# Patient Record
Sex: Male | Born: 1980
Health system: Southern US, Community
[De-identification: ages and names within clinical notes are randomized; demographics above are authoritative.]

## PROBLEM LIST (undated history)

## (undated) DIAGNOSIS — I471 Supraventricular tachycardia, unspecified: Secondary | ICD-10-CM

## (undated) DIAGNOSIS — F419 Anxiety disorder, unspecified: Secondary | ICD-10-CM

## (undated) HISTORY — DX: Anxiety disorder, unspecified: F41.9

---

## 2017-10-30 ENCOUNTER — Telehealth: Payer: Self-pay | Admitting: Medical

## 2017-10-30 ENCOUNTER — Encounter: Payer: Self-pay | Admitting: Medical

## 2017-10-30 ENCOUNTER — Ambulatory Visit: Payer: BLUE CROSS/BLUE SHIELD | Admitting: Medical

## 2017-10-30 VITALS — BP 132/72 | HR 71 | Resp 16 | Ht 72.0 in | Wt 167.6 lb

## 2017-10-30 DIAGNOSIS — R9431 Abnormal electrocardiogram [ECG] [EKG]: Secondary | ICD-10-CM

## 2017-10-30 DIAGNOSIS — R5383 Other fatigue: Secondary | ICD-10-CM

## 2017-10-30 DIAGNOSIS — R002 Palpitations: Secondary | ICD-10-CM | POA: Diagnosis not present

## 2017-10-30 LAB — TROPONIN I: Troponin I: <0.01 ng/mL (ref <=0.0)

## 2017-10-30 NOTE — Telephone Encounter (Signed)
Patient dropped off medical records and Ramon Dredgedward advised to place on [employer] desk. Made copies and gave the oringal back to patient

## 2017-10-30 NOTE — Patient Instructions (Addendum)
For your history of palpitations, I will refer you to cardiologist.  You had an extensive workup in [place] and would like copies of that workup to review and to send to the cardiologist.  You might need a Holter monitor.  During the interim I want you to hold/stop any caffeinated beverages and avoid any decongestants.  For fatigue since last palpitation event we will get CBC, CMP and TSH.  Your EKG overall looks good with probable artifact.  EKG machine different reading and will get a cardiac protein study to evaluate further.  If you get any recurrent palpitations that are constant or any chest pain that advise emergency department evaluation.  Regarding your syncope 1 year ago, I would like to review your old records first but also considering that you might need neurologist referral.  I am not sure  just yet as you only had one syncopal episode in the past.  Note if you do have syncope again at any point in time that would be emergency department evaluation same day.  Follow-up in 7 days or as needed.  EKG to me appears to be normal sinus rhythm.  I do not have an old EKG to compare.  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  To me it looks like he has artifact present.  My nurse did mention that he had a lot of hair on his chest.

## 2017-10-30 NOTE — Progress Notes (Signed)
Subjective:    Patient ID: Walter Harvey, male    DOB: [birth year], 36 y.o.   MRN: [id number]  HPI  Pt in for first time.   Pt is originally from [place]. Pt works at [employer]. Pt does not work out regularly. He likes to ride bicycle. Married- 3 children.  Pt states last week he had feeling of heart palpitation. He states he felt his heart beat out of sequence. This happened day after holloween. He drank 6 beers at least. Maybe more. Then next day felt palpitations.  Monday following holloween brief palpitation as well.(thse episodes lasted a minute or so then subsided)   Pt states a year ago after having sex he sat up quickly and seemed to shaked a little bit(wife told him later he looked stiff and rigid). Pt went to ED. He had work up. He states later got  stress tes, tilt test and echo all were negative. Before he past out he had no palpitation.  Work up done in [place]. No hx of seizure. No hx of head trauma.  Pt states in past never had holter monitor.     Review of Systems  Constitutional: Negative for chills, fatigue and fever.  Respiratory: Negative for cough, chest tightness, shortness of breath and wheezing.   Cardiovascular: Positive for palpitations. Negative for chest pain.  Gastrointestinal: Negative for abdominal pain, constipation, diarrhea, nausea and vomiting.  Musculoskeletal: Negative for back pain, joint swelling and neck pain.  Skin: Negative for rash.  Neurological: Negative for dizziness, speech difficulty, weakness, numbness and headaches.  Hematological: Negative for adenopathy. Does not bruise/bleed easily.  Psychiatric/Behavioral: Negative for behavioral problems, confusion and sleep disturbance. The patient is not nervous/anxious.      No past medical history on file.   Social History   Socioeconomic History  . Marital status: Married    Spouse name: Not on file  . Number of children: Not on file  . Years of education: Not on file  . Highest  education level: Not on file  Social Needs  . Financial resource strain: Not on file  . Food insecurity - worry: Not on file  . Food insecurity - inability: Not on file  . Transportation needs - medical: Not on file  . Transportation needs - non-medical: Not on file  Occupational History  . Not on file  Tobacco Use  . Smoking status: Never Smoker  . Smokeless tobacco: Never Used  Substance and Sexual Activity  . Alcohol use: Yes  . Drug use: No  . Sexual activity: Not on file  Other Topics Concern  . Not on file  Social History Narrative  . Not on file    No past surgical history on file.  No family history on file.  Not on File  No current outpatient medications on file prior to visit.   No current facility-administered medications on file prior to visit.     BP 132/72   Pulse 71   Resp 16   Ht 6' (1.829 m)   Wt 167 lb 9.6 oz (76 kg)   SpO2 97%   BMI 22.73 kg/m       Objective:   Physical Exam  General Mental Status- Alert. General Appearance- Not in acute distress.   Skin General: Color- Normal Color. Moisture- Normal Moisture.  Neck Carotid Arteries- Normal color. Moisture- Normal Moisture. No carotid bruits. No JVD.  Chest and Lung Exam Auscultation: Breath Sounds:-Normal.  Cardiovascular Auscultation:Rythm- Regular. Murmurs & Other Heart Sounds:Auscultation of  the heart reveals- No Murmurs.  Abdomen Inspection:-Inspeection Normal. Palpation/Percussion:Note:No mass. Palpation and Percussion of the abdomen reveal- Non Tender, Non Distended + BS, no rebound or guarding.   Neurologic Cranial Nerve exam:- CN III-XII intact(No nystagmus), symmetric smile. Strength:- 5/5 equal and symmetric strength both upper and lower extremities.      Assessment & Plan:  For your history of palpitations, I will refer you to cardiologist.  You had an extensive workup in [place] and would like copies of that workup to review and to send to the cardiologist.   You might need a Holter monitor.  During the interim I want you to hold/stop any caffeinated beverages and avoid any decongestants.  For fatigue since last palpitation event we will get CBC, CMP and TSH.  Your EKG overall looks good with probable artifact.  Machine EKG has differing reading and will get a cardiac protein study to evaluate further.  If you get any recurrent palpitations that are constant or any chest pain that advise emergency department evaluation.  Regarding your syncope 1 year ago, I would like to review your old records first but also considering that you might need neurologist referral.  I am not sure  just yet as you only had one syncopal episode in the past.  Note if you do have syncope again at any point in time that would be emergency department evaluation same day.  EKG to me appears to be normal sinus rhythm.  I do not have an old EKG to compare.  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  To me it looks like he has artifact present.  My nurse did mention that he had a lot of hair on his chest.   Follow-up in 7 days or as needed.  Purva Vessell, Ramon DredgeEdward, PA-C

## 2017-10-31 LAB — COMPREHENSIVE METABOLIC PANEL
ALT: 22 U/L (ref 0–53)
AST: 18 U/L (ref 0–37)
Albumin: 5 g/dL (ref 3.5–5.2)
Alkaline Phosphatase: 52 U/L (ref 39–117)
BILIRUBIN TOTAL: 1 mg/dL (ref 0.2–1.2)
BUN: 15 mg/dL (ref 6–23)
CALCIUM: 9.4 mg/dL (ref 8.4–10.5)
CO2: 31 mEq/L (ref 19–32)
Chloride: 102 mEq/L (ref 96–112)
Creatinine, Ser: 0.9 mg/dL (ref 0.40–1.50)
GFR: 100.3 mL/min (ref >60.00)
GLUCOSE: 95 mg/dL (ref 70–99)
Potassium: 4.2 mEq/L (ref 3.5–5.1)
SODIUM: 142 meq/L (ref 135–145)
Total Protein: 7.9 g/dL (ref 6.0–8.3)

## 2017-10-31 LAB — CBC WITH DIFFERENTIAL/PLATELET
BASOS PCT: 0.7 % (ref 0.0–3.0)
Basophils Absolute: 0 10*3/uL (ref 0.0–0.1)
EOS PCT: 2.3 % (ref 0.0–5.0)
Eosinophils Absolute: 0.2 10*3/uL (ref 0.0–0.7)
HCT: 45 % (ref 39.0–52.0)
Hemoglobin: 15.4 g/dL (ref 13.0–17.0)
LYMPHS ABS: 1.9 10*3/uL (ref 0.7–4.0)
Lymphocytes Relative: 28 % (ref 12.0–46.0)
MCHC: 34.3 g/dL (ref 30.0–36.0)
MCV: 96.2 fl (ref 78.0–100.0)
MONO ABS: 0.8 10*3/uL (ref 0.1–1.0)
Monocytes Relative: 11.8 % (ref 3.0–12.0)
NEUTROS ABS: 3.9 10*3/uL (ref 1.4–7.7)
NEUTROS PCT: 57.2 % (ref 43.0–77.0)
Platelets: 291 10*3/uL (ref 150.0–400.0)
RBC: 4.68 Mil/uL (ref 4.22–5.81)
RDW: 13.1 % (ref 11.5–15.5)
WBC: 6.8 10*3/uL (ref 4.0–10.5)

## 2017-10-31 LAB — TSH: TSH: 0.89 u[IU]/mL (ref 0.35–4.50)

## 2017-10-31 NOTE — Telephone Encounter (Signed)
PCP has records.

## 2017-11-01 ENCOUNTER — Telehealth: Payer: Self-pay | Admitting: *Deleted

## 2017-11-01 NOTE — Telephone Encounter (Signed)
Received results from [facility]; forwarded to provider/SLS 11/14

## 2017-11-13 ENCOUNTER — Ambulatory Visit: Payer: BLUE CROSS/BLUE SHIELD | Admitting: Cardiology

## 2017-11-16 ENCOUNTER — Encounter: Payer: Self-pay | Admitting: *Deleted

## 2017-11-20 ENCOUNTER — Encounter: Payer: Self-pay | Admitting: Cardiology

## 2017-11-20 ENCOUNTER — Ambulatory Visit: Payer: BLUE CROSS/BLUE SHIELD | Admitting: Cardiology

## 2017-11-20 DIAGNOSIS — I493 Ventricular premature depolarization: Secondary | ICD-10-CM | POA: Diagnosis not present

## 2017-11-20 DIAGNOSIS — R9431 Abnormal electrocardiogram [ECG] [EKG]: Secondary | ICD-10-CM | POA: Diagnosis not present

## 2017-11-20 DIAGNOSIS — R002 Palpitations: Secondary | ICD-10-CM | POA: Diagnosis not present

## 2017-11-20 NOTE — Patient Instructions (Addendum)
Medication Instructions:  Your physician recommends that you continue on your current medications as directed. Please refer to the Current Medication list given to you today.  Labwork: None   Testing/Procedures: Your physician has recommended that you wear an event monitor. Event monitors are medical devices that record the heart's electrical activity. Doctors most often us these monitors to diagnose arrhythmias. Arrhythmias are problems with the speed or rhythm of the heartbeat. The monitor is a small, portable device. You can wear one while you do your normal daily activities. This is usually used to diagnose what is causing palpitations/syncope (passing out).  Follow-Up: Your physician recommends that you schedule a follow-up appointment in: 6 weeks   Any Other Special Instructions Will Be Listed Below (If Applicable).  Please note that any paperwork needing to be filled out by the provider will need to be addressed at the front desk prior to seeing the provider. Please note that any paperwork FMLA, Disability or other documents regarding health condition is subject to a $25.00 charge that must be received prior to completion of paperwork in the form of a money order or check.     If you need a refill on your cardiac medications before your next appointment, please call your pharmacy.  

## 2017-11-20 NOTE — Progress Notes (Signed)
Cardiology Consultation:    Date:  11/20/2017   ID:  Walter ParentsMarcus Bolte, DOB 12-09-1981, MRN [id number]  PCP:  Esperanza RichtersSaguier, Edward, PA-C  Cardiologist:  Gypsy Balsamobert Krasowski, MD   Referring MD: Marisue BrooklynSaguier, Edward, PA-C   Chief Complaint  Patient presents with  . Palpitations  I am having palpitations  History of Present Illness:    Walter Harvey is a 36 y.o. male who is being seen today for the evaluation of palpitations at the request of Saguier, Ramon Dredgedward, [place]-C.  He is originally from [place] he came to this country in April to work full [employer].  He described quite stressful time he seems to be adapting quite well to new environment however his family does have some difficulties and he feels a lot of pressure on being responsible for taking care of them.  He has been experiencing some strange heartbeats and he had difficulty describing it it typically happens at rest when he does not do anything he feels that his heart beats to slow and somewhat forcefully.  There is no irregularity and that there is no tachycardia.  Interestingly he likes to exercise he rides a bike he also would like to go to gym when he does not he feels great he does not have any difficulties.  He brought some test from [place] in January he ended up going to a cardiologist because of one episode of syncope.  He apparently ate a lot he laid down and after that he got up quickly became dizzy and passed out.  He brought results of those test to me and there is an echocardiogram which was normal, he also had a stress test which was normal.  Tilt table test was done as well all were normal.  Recently he had batteries of blood test and those were fine.  Interesting no cholesterol check.  No past medical history on file.  No past surgical history on file.  Current Medications: No outpatient medications have been marked as taking for the 11/20/17 encounter (Office Visit) with Georgeanna LeaKrasowski, Robert J, MD.     Allergies:   Patient has no  known allergies.   Social History   Socioeconomic History  . Marital status: Married    Spouse name: None  . Number of children: None  . Years of education: None  . Highest education level: None  Social Needs  . Financial resource strain: None  . Food insecurity - worry: None  . Food insecurity - inability: None  . Transportation needs - medical: None  . Transportation needs - non-medical: None  Occupational History  . None  Tobacco Use  . Smoking status: Never Smoker  . Smokeless tobacco: Never Used  Substance and Sexual Activity  . Alcohol use: Yes  . Drug use: No  . Sexual activity: None  Other Topics Concern  . None  Social History Narrative  . None     Family History: The patient's family history is not on file. ROS:   Please see the history of present illness.    All 14 point review of systems negative except as described per history of present illness.  EKGs/Labs/Other Studies Reviewed:    The following studies were reviewed today: Echocardiogram, stress test, tilt table reviewed in January of this year in [place]  EKG:  EKG is  ordered today.  The ekg ordered today demonstrates sinus rhythm with sinus arrhythmia there is some PVCs, right axis deviation, pulmonary disease pattern,  Recent Labs: 10/30/2017: ALT 22; BUN 15;  Creatinine, Ser 0.90; Hemoglobin 15.4; Platelets 291.0; Potassium 4.2; Sodium 142; TSH 0.89  Recent Lipid Panel No results found for: CHOL, TRIG, HDL, CHOLHDL, VLDL, LDLCALC, LDLDIRECT  Physical Exam:    VS:  BP 102/64   Pulse 64   Resp 10   Ht 6' (1.829 m)   Wt 168 lb 1.9 oz (76.3 kg)   BMI 22.80 kg/m     Wt Readings from Last 3 Encounters:  11/20/17 168 lb 1.9 oz (76.3 kg)  10/30/17 167 lb 9.6 oz (76 kg)     GEN:  Well nourished, well developed in no acute distress HEENT: Normal NECK: No JVD; No carotid bruits LYMPHATICS: No lymphadenopathy CARDIAC: RRR, no murmurs, no rubs, no gallops RESPIRATORY:  Clear to auscultation  without rales, wheezing or rhonchi  ABDOMEN: Soft, non-tender, non-distended MUSCULOSKELETAL:  No edema; No deformity  SKIN: Warm and dry NEUROLOGIC:  Alert and oriented x 3 PSYCHIATRIC:  Normal affect   ASSESSMENT:    1. Palpitations   2. PVC's (premature ventricular contractions)   3. Abnormal EKG    PLAN:    In order of problems listed above:  1. Palpitations: I will ask him to wear event recorder.  I will like to know exactly what kind of arrhythmia he is experiencing.  I will not initiate any therapy for now.  The fact that in January he got the test done in [place] there were normal is encouraging.  On top of that he does not have any symptoms while exercising. 2. PVCs seem gentleman with apparently normal echocardiogram normal stress test.  Will wait for event recorder. 3. Abnormal EKG: Quite surprising since recent test done in [place] were normal.  We may be forced to repeat at least echocardiogram.   Medication Adjustments/Labs and Tests Ordered: Current medicines are reviewed at length with the patient today.  Concerns regarding medicines are outlined above.  No orders of the defined types were placed in this encounter.  No orders of the defined types were placed in this encounter.   Signed, Georgeanna Leaobert J. Krasowski, MD, [facility]. 11/20/2017 3:37 PM    [facility]

## 2017-11-22 ENCOUNTER — Ambulatory Visit: Payer: BLUE CROSS/BLUE SHIELD

## 2017-11-22 DIAGNOSIS — R002 Palpitations: Secondary | ICD-10-CM

## 2017-12-21 ENCOUNTER — Other Ambulatory Visit: Payer: Self-pay

## 2017-12-21 MED ORDER — METOPROLOL TARTRATE 25 MG PO TABS: 25.0000 mg | ORAL_TABLET | Freq: Two times a day (BID) | ORAL | 6 refills | Status: DC

## 2017-12-21 MED FILL — METOPROLOL TARTRATE 25 MG T: 25 | 30 days supply | Qty: 60 | Fill #0

## 2018-01-01 ENCOUNTER — Encounter: Payer: Self-pay | Admitting: Cardiology

## 2018-01-01 ENCOUNTER — Ambulatory Visit (INDEPENDENT_AMBULATORY_CARE_PROVIDER_SITE_OTHER): Payer: BLUE CROSS/BLUE SHIELD | Admitting: Cardiology

## 2018-01-01 DIAGNOSIS — I471 Supraventricular tachycardia, unspecified: Secondary | ICD-10-CM | POA: Insufficient documentation

## 2018-01-01 MED ORDER — METOPROLOL SUCCINATE ER 50 MG PO TB24: 50.0000 mg | ORAL_TABLET | Freq: Every day | ORAL | 6 refills | Status: DC

## 2018-01-01 MED FILL — METOPROLOL SUCC ER 50 MG TA: 50 | 30 days supply | Qty: 30 | Fill #0

## 2018-01-01 NOTE — Progress Notes (Signed)
Cardiology Office Note:    Date:  01/01/2018   ID:  Walter Harvey Pursell, DOB 28-Dec-1980, MRN [id number]  PCP:  Esperanza RichtersSaguier, Edward, PA-C  Cardiologist:  Gypsy Balsamobert Elize Pinon, MD    Referring MD: Marisue BrooklynSaguier, Edward, PA-C   Chief Complaint  Patient presents with  . 6 week follow up  He is doing well  History of Present Illness:    Walter Harvey Eleazer is a 37 y.o. male with palpitations as well as episodes of syncope.  He is originally from [place] he works Engineer, [employer] here.  [place] he did have quite extensive evaluation done which include echocardiogram which was normal: There was also stress test done which was normal, tilt table test.  All were normal.  Triggered this investigation was the fact that he passed out.  However he still complained of having some palpitations which include skipped beats as well as sustained palpitations.  He feels his heart speeding up.  There is no chest pain tightness squeezing pressure burning chest.  I put a event recorder on him an event recorder showed narrow complex tachycardia.  Put him on a beta-blocker and seems to be responding to the therapy quite well.  No past medical history on file.  No past surgical history on file.  Current Medications: Current Meds  Medication Sig  . metoprolol tartrate (LOPRESSOR) 25 MG tablet Take 1 tablet (25 mg total) by mouth 2 (two) times daily.     Allergies:   Patient has no known allergies.   Social History   Socioeconomic History  . Marital status: Married    Spouse name: None  . Number of children: None  . Years of education: None  . Highest education level: None  Social Needs  . Financial resource strain: None  . Food insecurity - worry: None  . Food insecurity - inability: None  . Transportation needs - medical: None  . Transportation needs - non-medical: None  Occupational History  . None  Tobacco Use  . Smoking status: Never Smoker  . Smokeless tobacco: Never Used  Substance and  Sexual Activity  . Alcohol use: Yes  . Drug use: No  . Sexual activity: None  Other Topics Concern  . None  Social History Narrative  . None     Family History: The patient's family history is not on file. ROS:   Please see the history of present illness.    All 14 point review of systems negative except as described per history of present illness  EKGs/Labs/Other Studies Reviewed:      Recent Labs: 10/30/2017: ALT 22; BUN 15; Creatinine, Ser 0.90; Hemoglobin 15.4; Platelets 291.0; Potassium 4.2; Sodium 142; TSH 0.89  Recent Lipid Panel No results found for: CHOL, TRIG, HDL, CHOLHDL, VLDL, LDLCALC, LDLDIRECT  Physical Exam:    VS:  BP 104/70   Pulse 77   Ht 6' (1.829 m)   Wt 164 lb 1.9 oz (74.4 kg)   SpO2 98%   BMI 22.26 kg/m     Wt Readings from Last 3 Encounters:  01/01/18 164 lb 1.9 oz (74.4 kg)  11/20/17 168 lb 1.9 oz (76.3 kg)  10/30/17 167 lb 9.6 oz (76 kg)     GEN:  Well nourished, well developed in no acute distress HEENT: Normal NECK: No JVD; No carotid bruits LYMPHATICS: No lymphadenopathy CARDIAC: RRR, no murmurs, no rubs, no gallops RESPIRATORY:  Clear to auscultation without rales, wheezing or rhonchi  ABDOMEN: Soft, non-tender, non-distended MUSCULOSKELETAL:  No  edema; No deformity  SKIN: Warm and dry LOWER EXTREMITIES: no swelling NEUROLOGIC:  Alert and oriented x 3 PSYCHIATRIC:  Normal affect   ASSESSMENT:    1. PSVT (paroxysmal supraventricular tachycardia) (HCC)    PLAN:    In order of problems listed above:  1. Pro ventricle tachycardia: Responded quite nicely so far to beta-blocker we talked about options in this situation he is interested in ablation.  He wants to be referred to EP for consideration of supraventricular tachycardia ablation.  I briefly explained to him procedure potential risks as well as benefits and he still want to go that route.  I will schedule him to see our EP team. 2. Abnormal EKG: His echocardiogram was done  in [place] just within the last year which was normal however he may end up repeating the test. 3. PVCs: He is on beta-blocker and I will change him to long-acting form of metoprolol for convenience. 4. Syncope: About a year ago quite extensive workup has been done all is negative.  Will refer him to EP for consideration of SVT ablation   Medication Adjustments/Labs and Tests Ordered: Current medicines are reviewed at length with the patient today.  Concerns regarding medicines are outlined above.  No orders of the defined types were placed in this encounter.  Medication changes: No orders of the defined types were placed in this encounter.   Signed, Georgeanna Lea, MD, [facility] 01/01/2018 3:26 PM    [facility]

## 2018-01-01 NOTE — Patient Instructions (Signed)
Medication Instructions:  Your physician has recommended you make the following change in your medication:  Stop the Metoprolol Tartrate Start Metoprolol Succinate 1 time daily  Labwork: None ordered  Testing/Procedures: None ordered  Follow-Up: Your physician recommends that you schedule a follow-up appointment in: 2 months with Dr. Nikki DomKrasowski  You will also be contacted by our EP scheduling for an appointment.   Any Other Special Instructions Will Be Listed Below (If Applicable).     If you need a refill on your cardiac medications before your next appointment, please call your pharmacy.

## 2018-01-16 ENCOUNTER — Ambulatory Visit: Payer: BLUE CROSS/BLUE SHIELD | Admitting: Cardiology

## 2018-01-16 ENCOUNTER — Encounter: Payer: Self-pay | Admitting: Cardiology

## 2018-01-16 ENCOUNTER — Encounter (INDEPENDENT_AMBULATORY_CARE_PROVIDER_SITE_OTHER): Payer: Self-pay

## 2018-01-16 VITALS — BP 102/64 | HR 66 | Ht 72.0 in | Wt 163.0 lb

## 2018-01-16 DIAGNOSIS — I471 Supraventricular tachycardia: Secondary | ICD-10-CM

## 2018-01-16 NOTE — Progress Notes (Signed)
Electrophysiology Office Note   Date:  01/16/2018   ID:  Tirso Laws, DOB [birth year], MRN [id number]  PCP:  Esperanza Richters, PA-C  Cardiologist:  Bing Matter Primary Electrophysiologist:  Regan Lemming, MD    Chief Complaint  Patient presents with  . Advice Only    SVT/Discuss SVT ablation     History of Present Illness: Walter Harvey is a 37 y.o. male who is being seen today for the evaluation of SVT at the request of Mauri Brooklyn. Presenting today for electrophysiology evaluation.  He is originally from [place].  He came to the [place] to work for [employer] in April.  He had been having palpitations.  They typically occur when at rest.  No regularity and no tachycardia.  He did have one episode of syncope in [place].  Previous echo and stress test, as well as tilt table were normal.  Event recorder showed a narrow complex tachycardia.  He was put on Toprol-XL.  10 you to have short episodes on Toprol-XL.  His main symptoms are palpitations.  He does not get short of breath or weak or fatigued.  His symptoms occur mainly when he is tired or sleep deprived.    Today, he denies symptoms of palpitations, chest pain, shortness of breath, orthopnea, PND, lower extremity edema, claudication, dizziness, presyncope, syncope, bleeding, or neurologic sequela. The patient is tolerating medications without difficulties.    No past medical history on file. no past history No past surgical history on file. no surgical history   Current Outpatient Medications  Medication Sig Dispense Refill  . metoprolol succinate (TOPROL-XL) 50 MG 24 hr tablet Take 1 tablet (50 mg total) by mouth daily. Take with or immediately following a meal. 30 tablet 6   No current facility-administered medications for this visit.     Allergies:   Patient has no known allergies.   Social History:  The patient  reports that  has never smoked. he has never used smokeless tobacco. He reports that he drinks alcohol. He  reports that he does not use drugs.   Family History:  The patient's family is without health problems   ROS:  Please see the history of present illness.   Otherwise, review of systems is positive for palpitations.   All other systems are reviewed and negative.    PHYSICAL EXAM: VS:  BP 102/64   Pulse 66   Ht 6' (1.829 m)   Wt 163 lb (73.9 kg)   BMI 22.11 kg/m  , BMI Body mass index is 22.11 kg/m. GEN: Well nourished, well developed, in no acute distress  HEENT: normal  Neck: no JVD, carotid bruits, or masses Cardiac: RRR; no murmurs, rubs, or gallops,no edema  Respiratory:  clear to auscultation bilaterally, normal work of breathing GI: soft, nontender, nondistended, + BS MS: no deformity or atrophy  Skin: warm and dry Neuro:  Strength and sensation are intact Psych: euthymic mood, full affect  EKG:  EKG is not ordered today. Personal review of the ekg ordered 11/20/17 shows sinus rhythm, PVCs, rate 80  Recent Labs: 10/30/2017: ALT 22; BUN 15; Creatinine, Ser 0.90; Hemoglobin 15.4; Platelets 291.0; Potassium 4.2; Sodium 142; TSH 0.89    Lipid Panel  No results found for: CHOL, TRIG, HDL, CHOLHDL, VLDL, LDLCALC, LDLDIRECT   Wt Readings from Last 3 Encounters:  01/16/18 163 lb (73.9 kg)  01/01/18 164 lb 1.9 oz (74.4 kg)  11/20/17 168 lb 1.9 oz (76.3 kg)      Other studies Reviewed:  Additional studies/ records that were reviewed today include: Holter 11/22/17 - personally reviewed  Review of the above records today demonstrates: SVT   ASSESSMENT AND PLAN:  1.  SVT: Found on Holter monitor.  He has also had multiple PVCs.  He appears to be asymptomatic from his PVCs.  I did discuss ablation for his SVT.  Risks and benefits include bleeding, tamponade, heart block, and stroke.  He understands these risks and would like to consider his options further.  We also discussed adjusting medications.  He Milah Recht call his insurance company to check on prices for  ablation.    Current medicines are reviewed at length with the patient today.   The patient does not have concerns regarding his medicines.  The following changes were made today:  none  Labs/ tests ordered today include:  No orders of the defined types were placed in this encounter.  Note sent to referring provider  Disposition:   FU with Aleesha Ringstad 6 months  Signed, Mozetta Murfin Jorja LoaMartin Oluwademilade Kellett, MD  01/16/2018 10:25 AM     [facility] [address] [place] [address] [phone] (office) [phone] (fax)

## 2018-01-16 NOTE — Patient Instructions (Addendum)
Medication Instructions:  Your physician recommends that you continue on your current medications as directed. Please refer to the Current Medication list given to you today.  * If you need a refill on your cardiac medications before your next appointment, please call your pharmacy. *  Labwork: None ordered  Testing/Procedures: Your physician has recommended that you have an ablation. Catheter ablation is a medical procedure used to treat some cardiac arrhythmias (irregular heartbeats). During catheter ablation, a long, thin, flexible tube is put into a blood vessel in your groin (upper thigh), or neck. This tube is called an ablation catheter. It is then guided to your heart through the blood vessel. Radio frequency waves destroy small areas of heart tissue where abnormal heartbeats may cause an arrhythmia to start.   Please call the office if you decide to proceed with ablation, otherwise we will see you in 6 months  Follow-Up: Your physician wants you to follow-up in: 6 months with Dr. Elberta Fortisamnitz.  You will receive a reminder letter in the mail two months in advance. If you don't receive a letter, please call our office to schedule the follow-up appointment.  Thank you for choosing [facility]!!    Dory HornSherri Taline Nass, RN [phone]  Any Other Special Instructions Will Be Listed Below (If Applicable).  CPT code for ablation: [id number]   Cardiac Ablation Cardiac ablation is a procedure to disable (ablate) a small amount of heart tissue in very specific places. The heart has many electrical connections. Sometimes these connections are abnormal and can cause the heart to beat very fast or irregularly. Ablating some of the problem areas can improve the heart rhythm or return it to normal. Ablation may be done for people who:  Have Wolff-Parkinson-White syndrome.  Have fast heart rhythms (tachycardia).  Have taken medicines for an abnormal heart rhythm (arrhythmia) that were not effective or  caused side effects.  Have a high-risk heartbeat that may be life-threatening.  During the procedure, a small incision is made in the neck or the groin, and a long, thin, flexible tube (catheter) is inserted into the incision and moved to the heart. Small devices (electrodes) on the tip of the catheter will send out electrical currents. A type of X-ray (fluoroscopy) will be used to help guide the catheter and to provide images of the heart. Tell a health care provider about:  Any allergies you have.  All medicines you are taking, including vitamins, herbs, eye drops, creams, and over-the-counter medicines.  Any problems you or family members have had with anesthetic medicines.  Any blood disorders you have.  Any surgeries you have had.  Any medical conditions you have, such as kidney failure.  Whether you are pregnant or may be pregnant. What are the risks? Generally, this is a safe procedure. However, problems may occur, including:  Infection.  Bruising and bleeding at the catheter insertion site.  Bleeding into the chest, especially into the sac that surrounds the heart. This is a serious complication.  Stroke or blood clots.  Damage to other structures or organs.  Allergic reaction to medicines or dyes.  Need for a permanent pacemaker if the normal electrical system is damaged. A pacemaker is a small computer that sends electrical signals to the heart and helps your heart beat normally.  The procedure not being fully effective. This may not be recognized until months later. Repeat ablation procedures are sometimes required.  What happens before the procedure?  Follow instructions from your health care provider about eating  or drinking restrictions.  Ask your health care provider about: ? Changing or stopping your regular medicines. This is especially important if you are taking diabetes medicines or blood thinners. ? Taking medicines such as aspirin and ibuprofen.  These medicines can thin your blood. Do not take these medicines before your procedure if your health care provider instructs you not to.  Plan to have someone take you home from the hospital or clinic.  If you will be going home right after the procedure, plan to have someone with you for 24 hours. What happens during the procedure?  To lower your risk of infection: ? Your health care team will wash or sanitize their hands. ? Your skin will be washed with soap. ? Hair may be removed from the incision area.  An IV tube will be inserted into one of your veins.  You will be given a medicine to help you relax (sedative).  The skin on your neck or groin will be numbed.  An incision will be made in your neck or your groin.  A needle will be inserted through the incision and into a large vein in your neck or groin.  A catheter will be inserted into the needle and moved to your heart.  Dye may be injected through the catheter to help your surgeon see the area of the heart that needs treatment.  Electrical currents will be sent from the catheter to ablate heart tissue in desired areas. There are three types of energy that may be used to ablate heart tissue: ? Heat (radiofrequency energy). ? Laser energy. ? Extreme cold (cryoablation).  When the necessary tissue has been ablated, the catheter will be removed.  Pressure will be held on the catheter insertion area to prevent excessive bleeding.  A bandage (dressing) will be placed over the catheter insertion area. The procedure may vary among health care providers and hospitals. What happens after the procedure?  Your blood pressure, heart rate, breathing rate, and blood oxygen level will be monitored until the medicines you were given have worn off.  Your catheter insertion area will be monitored for bleeding. You will need to lie still for a few hours to ensure that you do not bleed from the catheter insertion area.  Do not drive  for 24 hours or as long as directed by your health care provider. Summary  Cardiac ablation is a procedure to disable (ablate) a small amount of heart tissue in very specific places. Ablating some of the problem areas can improve the heart rhythm or return it to normal.  During the procedure, electrical currents will be sent from the catheter to ablate heart tissue in desired areas. This information is not intended to replace advice given to you by your health care provider. Make sure you discuss any questions you have with your health care provider. Document Released: 04/23/2009 Document Revised: 10/24/2016 Document Reviewed: 10/24/2016 Elsevier Interactive Patient Education  [employer].

## 2018-01-17 ENCOUNTER — Institutional Professional Consult (permissible substitution): Payer: BLUE CROSS/BLUE SHIELD | Admitting: Cardiology

## 2018-01-24 ENCOUNTER — Institutional Professional Consult (permissible substitution): Payer: BLUE CROSS/BLUE SHIELD | Admitting: Cardiology

## 2018-01-26 DIAGNOSIS — J029 Acute pharyngitis, unspecified: Secondary | ICD-10-CM | POA: Diagnosis not present

## 2018-01-26 DIAGNOSIS — R6889 Other general symptoms and signs: Secondary | ICD-10-CM | POA: Diagnosis not present

## 2018-01-26 MED FILL — OSELTAMIVIR PHOSPHATE 75 MG: 75 | 5 days supply | Qty: 10 | Fill #0

## 2018-01-26 MED FILL — METOPROLOL SUCC ER 50 MG TA: 50 | 30 days supply | Qty: 30 | Fill #1

## 2018-02-07 DIAGNOSIS — J020 Streptococcal pharyngitis: Secondary | ICD-10-CM | POA: Diagnosis not present

## 2018-03-07 ENCOUNTER — Encounter: Payer: Self-pay | Admitting: Cardiology

## 2018-03-07 ENCOUNTER — Ambulatory Visit: Payer: BLUE CROSS/BLUE SHIELD | Admitting: Cardiology

## 2018-03-07 VITALS — BP 120/66 | HR 73 | Ht 72.0 in | Wt 163.1 lb

## 2018-03-07 DIAGNOSIS — R002 Palpitations: Secondary | ICD-10-CM | POA: Diagnosis not present

## 2018-03-07 DIAGNOSIS — I493 Ventricular premature depolarization: Secondary | ICD-10-CM

## 2018-03-07 DIAGNOSIS — I471 Supraventricular tachycardia, unspecified: Secondary | ICD-10-CM

## 2018-03-07 MED ORDER — METOPROLOL SUCCINATE ER 50 MG PO TB24: ORAL_TABLET | ORAL | 6 refills | Status: DC

## 2018-03-07 MED FILL — METOPROLOL SUCCINATE ER 50: 50 | 30 days supply | Qty: 45 | Fill #0

## 2018-03-07 NOTE — Progress Notes (Signed)
Cardiology Office Note:    Date:  03/07/2018   ID:  Walter Harvey Camilo, DOB [birth year], MRN [id number]  PCP:  Esperanza RichtersSaguier, Edward, PA-C  Cardiologist:  Gypsy Balsamobert Zanetta Dehaan, MD    Referring MD: Marisue BrooklynSaguier, Edward, PA-C   Chief Complaint  Patient presents with  . 2 month follow up  Still having some palpitations  History of Present Illness:    Walter Harvey Faso is a 37 y.o. male with supraventricular tachycardia and palpitations.  We had a long discussion about what to do with the situation he was referred to EP and SVT ablation was offered to him he still undecided which way he wants to do first he tells me that he wants to have ablation that he start talking about potentially higher price that he does not want to do it until he knows how much she will paid for it.  I presented him with 3 options option #1 tinea doing what we are doing right now with the understanding that he will get some palpitations from time to time.  Second option was to increase dose of beta-blocker therapy option to proceed with ablation.  He agreed to increase dose of his metoprolol therefore I will check his EKG if EKG is fine with Toprol dose will be increased.  In the meantime we will investigate how much it will cost to do ablation and try to advise him of that.  No past medical history on file.  No past surgical history on file.  Current Medications: Current Meds  Medication Sig  . metoprolol succinate (TOPROL-XL) 50 MG 24 hr tablet Take 1 tablet (50 mg total) by mouth daily. Take with or immediately following a meal.     Allergies:   Patient has no known allergies.   Social History   Socioeconomic History  . Marital status: Married    Spouse name: None  . Number of children: None  . Years of education: None  . Highest education level: None  Social Needs  . Financial resource strain: None  . Food insecurity - worry: None  . Food insecurity - inability: None  . Transportation needs - medical: None  .  Transportation needs - non-medical: None  Occupational History  . None  Tobacco Use  . Smoking status: Never Smoker  . Smokeless tobacco: Never Used  Substance and Sexual Activity  . Alcohol use: Yes  . Drug use: No  . Sexual activity: None  Other Topics Concern  . None  Social History Narrative  . None     Family History: The patient's family history is not on file. ROS:   Please see the history of present illness.    All 14 point review of systems negative except as described per history of present illness  EKGs/Labs/Other Studies Reviewed:      Recent Labs: 10/30/2017: ALT 22; BUN 15; Creatinine, Ser 0.90; Hemoglobin 15.4; Platelets 291.0; Potassium 4.2; Sodium 142; TSH 0.89  Recent Lipid Panel No results found for: CHOL, TRIG, HDL, CHOLHDL, VLDL, LDLCALC, LDLDIRECT  Physical Exam:    VS:  BP 120/66   Pulse 73   Ht 6' (1.829 m)   Wt 163 lb 1.9 oz (74 kg)   SpO2 98%   BMI 22.12 kg/m     Wt Readings from Last 3 Encounters:  03/07/18 163 lb 1.9 oz (74 kg)  01/16/18 163 lb (73.9 kg)  01/01/18 164 lb 1.9 oz (74.4 kg)     GEN:  Well nourished, well developed in no acute distress  HEENT: Normal NECK: No JVD; No carotid bruits LYMPHATICS: No lymphadenopathy CARDIAC: RRR, no murmurs, no rubs, no gallops RESPIRATORY:  Clear to auscultation without rales, wheezing or rhonchi  ABDOMEN: Soft, non-tender, non-distended MUSCULOSKELETAL:  No edema; No deformity  SKIN: Warm and dry LOWER EXTREMITIES: no swelling NEUROLOGIC:  Alert and oriented x 3 PSYCHIATRIC:  Normal affect   ASSESSMENT:    1. PSVT (paroxysmal supraventricular tachycardia) (HCC)   2. PVC's (premature ventricular contractions)   3. Palpitations    PLAN:    In order of problems listed above:  1. Paroxysmal supraventricular tachycardia: Plan as outlined above.  EKG will be done. 2. Palpitations: Will do EKG to see if I can increase dose of his beta-blocker.   Medication Adjustments/Labs and  Tests Ordered: Current medicines are reviewed at length with the patient today.  Concerns regarding medicines are outlined above.  No orders of the defined types were placed in this encounter.  Medication changes: No orders of the defined types were placed in this encounter.   Signed, Georgeanna Lea, MD, [facility] 03/07/2018 2:32 PM     [facility]

## 2018-03-07 NOTE — Patient Instructions (Addendum)
Medication Instructions:  Your physician has recommended you make the following change in your medication:  INCREASE metroprolol succinate 75 mg daily (take 0.5 tablet in the morning and 1 whole tablet in evening)  Labwork: None  Testing/Procedures: You had an EKG today.   Follow-Up: Your physician recommends that you schedule a follow-up appointment in: 3 months.   Any Other Special Instructions Will Be Listed Below (If Applicable).     If you need a refill on your cardiac medications before your next appointment, please call your pharmacy.

## 2018-03-12 ENCOUNTER — Telehealth: Payer: Self-pay | Admitting: Cardiology

## 2018-03-12 NOTE — Telephone Encounter (Signed)
Informed pt that I would "schedule" procedure for late April.  Explained that I would send request to pre-certify with insurance and call him once I know this has been completed. Pt states insurance asked us to call to answer specific questions r/t procedure.  Explained that we typically don't call insurance, but will send information requesting pre-cert.          (If need to speak w/ them call: [phone])

## 2018-03-12 NOTE — Telephone Encounter (Signed)
New Message   Patient is calling in reference to a cardiac ablation that he is scheduled to have. He advises that his insurance is requesting a prior authorization to be done. Please call to discuss.

## 2018-03-19 NOTE — Telephone Encounter (Signed)
lmtcb

## 2018-03-20 NOTE — Telephone Encounter (Signed)
Pt returns my call. Informed patient that I spoke with the hospital billing.  Informed that he will pay an estimated $2780 out of pocket for SVT ablation. Pt is going to call back if he would like to proceed. Procedure cancelled at this time until pt calls back with a decision.

## 2018-03-29 ENCOUNTER — Telehealth: Payer: Self-pay | Admitting: Cardiology

## 2018-03-29 ENCOUNTER — Emergency Department (HOSPITAL_BASED_OUTPATIENT_CLINIC_OR_DEPARTMENT_OTHER): Payer: BLUE CROSS/BLUE SHIELD

## 2018-03-29 ENCOUNTER — Emergency Department (HOSPITAL_BASED_OUTPATIENT_CLINIC_OR_DEPARTMENT_OTHER)
Admission: EM | Admit: 2018-03-29 | Discharge: 2018-03-29 | Disposition: A | Discharge status: Home / Self Care | Payer: BLUE CROSS/BLUE SHIELD | Attending: Emergency Medicine | Admitting: Emergency Medicine

## 2018-03-29 ENCOUNTER — Other Ambulatory Visit: Payer: Self-pay

## 2018-03-29 ENCOUNTER — Encounter (HOSPITAL_BASED_OUTPATIENT_CLINIC_OR_DEPARTMENT_OTHER): Payer: Self-pay

## 2018-03-29 DIAGNOSIS — Z8679 Personal history of other diseases of the circulatory system: Secondary | ICD-10-CM | POA: Diagnosis not present

## 2018-03-29 DIAGNOSIS — R072 Precordial pain: Secondary | ICD-10-CM | POA: Insufficient documentation

## 2018-03-29 DIAGNOSIS — Z79899 Other long term (current) drug therapy: Secondary | ICD-10-CM | POA: Insufficient documentation

## 2018-03-29 DIAGNOSIS — R079 Chest pain, unspecified: Secondary | ICD-10-CM | POA: Diagnosis not present

## 2018-03-29 HISTORY — DX: Supraventricular tachycardia: I47.1

## 2018-03-29 HISTORY — DX: Supraventricular tachycardia, unspecified: I47.10

## 2018-03-29 LAB — CBC
HEMATOCRIT: 40.3 % (ref 39.0–52.0)
HEMOGLOBIN: 14.2 g/dL (ref 13.0–17.0)
MCH: 32.3 pg (ref 26.0–34.0)
MCHC: 35.2 g/dL (ref 30.0–36.0)
MCV: 91.8 fL (ref 78.0–100.0)
Platelets: 256 10*3/uL (ref 150–400)
RBC: 4.39 MIL/uL (ref 4.22–5.81)
RDW: 12.8 % (ref 11.5–15.5)
WBC: 6.9 10*3/uL (ref 4.0–10.5)

## 2018-03-29 LAB — BASIC METABOLIC PANEL
ANION GAP: 9 (ref 5–15)
BUN: 13 mg/dL (ref 6–20)
CALCIUM: 9.2 mg/dL (ref 8.9–10.3)
CHLORIDE: 102 mmol/L (ref 101–111)
CO2: 24 mmol/L (ref 22–32)
Creatinine, Ser: 0.81 mg/dL (ref 0.61–1.24)
GFR calc Af Amer: >60 mL/min (ref >60)
GFR calc non Af Amer: >60 mL/min (ref >60)
GLUCOSE: 98 mg/dL (ref 65–99)
Potassium: 3.9 mmol/L (ref 3.5–5.1)
Sodium: 135 mmol/L (ref 135–145)

## 2018-03-29 LAB — TROPONIN I: Troponin I: <0.03 ng/mL (ref <0.03)

## 2018-03-29 MED ORDER — GI COCKTAIL ~~LOC~~
30.0000 mL | Freq: Once | ORAL | Status: AC
  Administered 2018-03-29: 30 mL via ORAL
  Filled 2018-03-29: qty 30

## 2018-03-29 NOTE — ED Triage Notes (Signed)
C/o CP days-NAD-steady gait

## 2018-03-29 NOTE — Telephone Encounter (Signed)
Patient has been experiencing constant chest pain since 3:00 am. Patient says he does experience palpitations at times but this is a different kind of sensation. Advised patient to go to the nearest emergency room to be evaluated. Patient agreeable and verbalized understanding.

## 2018-03-29 NOTE — Discharge Instructions (Signed)
Please read and follow all provided instructions.  Your diagnoses today include:  1. Precordial pain     Tests performed today include:  An EKG of your heart  A chest x-ray  Cardiac enzymes - a blood test for heart muscle damage  Blood counts and electrolytes  Vital signs. See below for your results today.   Medications prescribed:   None  Take any prescribed medications only as directed.  Follow-up instructions: Please follow-up with your primary care provider as soon as you can for further evaluation of your symptoms.   Return instructions:  SEEK IMMEDIATE MEDICAL ATTENTION IF:  You have severe chest pain, especially if the pain is crushing or pressure-like and spreads to the arms, back, neck, or jaw, or if you have sweating, nausea (feeling sick to your stomach), or shortness of breath. THIS IS AN EMERGENCY. Don't wait to see if the pain will go away. Get medical help at once. Call 911 or 0 (operator). DO NOT drive yourself to the hospital.   Your chest pain gets worse and does not go away with rest.   You have an attack of chest pain lasting longer than usual, despite rest and treatment with the medications your caregiver has prescribed.   You wake from sleep with chest pain or shortness of breath.  You feel dizzy or faint.  You have chest pain not typical of your usual pain for which you originally saw your caregiver.   You have any other emergent concerns regarding your health.  Additional Information: Chest pain comes from many different causes. Your caregiver has diagnosed you as having chest pain that is not specific for one problem, but does not require admission.  You are at low risk for an acute heart condition or other serious illness.   Your vital signs today were: BP 119/74    Pulse 66    Temp 98.8 F (37.1 C) (Oral)    Resp 18    Ht 5\' 9"  (1.753 m)    Wt 73.5 kg (162 lb)    SpO2 99%    BMI 23.92 kg/m  If your blood pressure (BP) was elevated above  135/85 this visit, please have this repeated by your doctor within one month. --------------

## 2018-03-29 NOTE — ED Provider Notes (Signed)
[facility] EMERGENCY DEPARTMENT Provider Note   CSN: [id number] Arrival date & time: 03/29/18  1559     History   Chief Complaint Chief Complaint  Patient presents with  . Chest Pain    HPI Walter Harvey is a 37 y.o. male.  Patient with history of SVT presents to the emergency department today with ongoing chest pain.  Patient describes the pain as a sharp discomfort in the left chest with radiation to the left arm.  He has not had these symptoms in the past.  It has seemed to be waxing and waning over the past day.  No associated shortness of breath, diaphoresis, vomiting.  Patient denies any fever or cough.  Pain can began at rest.  No lower extremity swelling or abdominal pain.  No treatments prior to arrival.  Patient had a stress test performed and an echocardiogram about 1-2 years ago.  This was reportedly normal. Patient denies risk factors for pulmonary embolism including: unilateral leg swelling, history of DVT/PE/other blood clots, use of exogenous hormones, recent immobilizations, recent surgery, recent travel (>4hr segment), malignancy, hemoptysis.       Past Medical History:  Diagnosis Date  . SVT (supraventricular tachycardia) [facility])     Patient Active Problem List   Diagnosis Date Noted  . PSVT (paroxysmal supraventricular tachycardia) (HCC) 01/01/2018  . Palpitations 11/20/2017  . PVC's (premature ventricular contractions) 11/20/2017  . Abnormal EKG 11/20/2017    History reviewed. No pertinent surgical history.      Home Medications    Prior to Admission medications   Medication Sig Start Date End Date Taking? Authorizing Provider  metoprolol succinate (TOPROL-XL) 50 MG 24 hr tablet Take 0.5 tablet in the morning and 1 whole tablet in evening 03/07/18   Georgeanna Lea, MD    Family History No family history on file.  Social History Social History   Tobacco Use  . Smoking status: Never Smoker  . Smokeless tobacco: Never Used    Substance Use Topics  . Alcohol use: Yes  . Drug use: No     Allergies   Patient has no known allergies.   Review of Systems Review of Systems  Constitutional: Negative for diaphoresis and fever.  Eyes: Negative for redness.  Respiratory: Negative for cough and shortness of breath.   Cardiovascular: Positive for chest pain. Negative for palpitations and leg swelling.  Gastrointestinal: Negative for abdominal pain, nausea and vomiting.  Genitourinary: Negative for dysuria.  Musculoskeletal: Negative for back pain and neck pain.  Skin: Negative for rash.  Neurological: Negative for syncope and light-headedness.  Psychiatric/Behavioral: The patient is not nervous/anxious.      Physical Exam Updated Vital Signs BP 130/83 (BP Location: Left Arm)   Pulse 63   Temp 98.8 F (37.1 C) (Oral)   Resp 18   Ht 5\' 9"  (1.753 m)   Wt 73.5 kg (162 lb)   SpO2 98%   BMI 23.92 kg/m   Physical Exam  Constitutional: He appears well-developed and well-nourished.  HENT:  Head: Normocephalic and atraumatic.  Mouth/Throat: Mucous membranes are normal. Mucous membranes are not dry.  Eyes: Conjunctivae are normal.  Neck: Trachea normal and normal range of motion. Neck supple. Normal carotid pulses and no JVD present. No muscular tenderness present. Carotid bruit is not present. No tracheal deviation present.  Cardiovascular: Normal rate, regular rhythm, S1 normal, S2 normal, normal heart sounds and intact distal pulses. Exam reveals no distant heart sounds and no decreased pulses.  No murmur  heard. Pulmonary/Chest: Effort normal and breath sounds normal. No respiratory distress. He has no wheezes. He has no rhonchi. He has no rales. He exhibits no tenderness.  Abdominal: Soft. Normal aorta and bowel sounds are normal. There is no tenderness. There is no rebound and no guarding.  Musculoskeletal: He exhibits no edema.  Neurological: He is alert.  Skin: Skin is warm and dry. He is not  diaphoretic. No cyanosis. No pallor.  Psychiatric: He has a normal mood and affect.  Nursing note and vitals reviewed.    ED Treatments / Results  Labs (all labs ordered are listed, but only abnormal results are displayed) Labs Reviewed  BASIC METABOLIC PANEL  CBC  TROPONIN I    EKG EKG Interpretation  Date/Time:  Thursday March 29 2018 16:16:34 EDT Ventricular Rate:  67 PR Interval:  128 QRS Duration: 92 QT Interval:  418 QTC Calculation: 441 R Axis:   90 Text Interpretation:  Normal sinus rhythm Biatrial enlargement Rightward axis Pulmonary disease pattern Incomplete right bundle branch block Abnormal ECG NO STEMI No old tracing to compare Confirmed by Drema Pry [phone]) on 03/29/2018 4:21:30 PM   Radiology Dg Chest 2 View  Result Date: 03/29/2018 CLINICAL DATA:  37 year old male with left side chest pain since yesterday. EXAM: CHEST - 2 VIEW COMPARISON:  None. FINDINGS: Normal lung volumes. Normal cardiac size and mediastinal contours. Visualized tracheal air column is within normal limits. Both lungs appear clear. No pneumothorax or pleural effusion. Minor dextroconvex thoracic spine curvature. No other osseous abnormality identified. Negative visible bowel gas pattern. IMPRESSION: Negative.  No acute cardiopulmonary abnormality. Electronically Signed   By: Odessa Fleming M.D.   On: 03/29/2018 16:31    Procedures Procedures (including critical care time)  Medications Ordered in ED Medications  gi cocktail (Maalox,Lidocaine,Donnatal) (30 mLs Oral Given 03/29/18 1817)     Initial Impression / Assessment and Plan / ED Course  I have reviewed the triage vital signs and the nursing notes.  Pertinent labs & imaging results that were available during my care of the patient were reviewed by me and considered in my medical decision making (see chart for details).     Patient seen and examined. EKG reviewed, unchanged from 03/07/2018. Work-up initiated. Medications ordered.    Vital signs reviewed and are as follows: BP 130/83 (BP Location: Left Arm)   Pulse 63   Temp 98.8 F (37.1 C) (Oral)   Resp 18   Ht 5\' 9"  (1.753 m)   Wt 73.5 kg (162 lb)   SpO2 98%   BMI 23.92 kg/m   Patient updated on all results.  He is pain-free at the current time.  GI cocktail given to help prevent recurrent symptoms if this is GI related.  Discussed negative troponin and normal chest x-ray.  Encourage close PCP follow-up.  Encouraged return if symptoms return or worsen.  Patient was counseled to return with severe chest pain, especially if the pain is crushing or pressure-like and spreads to the arms, back, neck, or jaw, or if they have sweating, nausea, or shortness of breath with the pain. They were encouraged to call 911 with these symptoms.   The patient verbalized understanding and agreed.    Final Clinical Impressions(s) / ED Diagnoses   Final diagnoses:  Precordial pain   Patient with nonspecific, atypical chest pain.  Nonexertional.  No shortness of breath, vomiting, or diaphoresis.  When pain present, does radiate to the left arm.  Symptoms began yesterday.  Troponin negative x1.  EKG is abnormal but unchanged.  No risk factors for PE.  Patient is not currently in SVT.  Patient has close PCP follow-up.    ED Discharge Orders    None       Renne CriglerGeiple, Goldie Dimmer, Cordelia Poche-C 03/29/18 1902    Nira Connardama, Pedro Eduardo, MD 03/29/18 [phone]

## 2018-03-29 NOTE — Telephone Encounter (Signed)
He's not feeling well, has had a pain on his heart since yesterday

## 2018-04-05 ENCOUNTER — Ambulatory Visit (INDEPENDENT_AMBULATORY_CARE_PROVIDER_SITE_OTHER): Payer: BLUE CROSS/BLUE SHIELD | Admitting: Cardiology

## 2018-04-05 ENCOUNTER — Encounter: Payer: Self-pay | Admitting: Cardiology

## 2018-04-05 VITALS — BP 110/64 | HR 72 | Ht 72.0 in | Wt 162.1 lb

## 2018-04-05 DIAGNOSIS — I471 Supraventricular tachycardia, unspecified: Secondary | ICD-10-CM

## 2018-04-05 DIAGNOSIS — F419 Anxiety disorder, unspecified: Secondary | ICD-10-CM

## 2018-04-05 DIAGNOSIS — R0789 Other chest pain: Secondary | ICD-10-CM

## 2018-04-05 HISTORY — DX: Anxiety disorder, unspecified: F41.9

## 2018-04-05 MED ORDER — ALPRAZOLAM 0.25 MG PO TABS: 0.2500 mg | ORAL_TABLET | Freq: Three times a day (TID) | ORAL | 0 refills | Status: DC | PRN

## 2018-04-05 NOTE — Progress Notes (Signed)
Cardiology Office Note:    Date:  04/05/2018   ID:  Walter Harvey, DOB 01/10/1981, MRN [id number]  PCP:  Esperanza RichtersSaguier, Edward, PA-C  Cardiologist:  Gypsy Balsamobert Alisen Marsiglia, MD    Referring MD: Marisue BrooklynSaguier, Edward, PA-C   Chief Complaint  Patient presents with  . Chest Pain  Having chest pain  History of Present Illness:    Walter Harvey is a 37 y.o. male with history of supraventricular tachycardia.  He walking to our office and requested to be seen.  For last 10 days he has been experiencing chest pain.  Pain he described as burning in the left side of his chest with some pinches needlelike.  He called our office last week he was advised to go to the emergency room visit in the emergency room showed normal troponins EKG without any acute changes he was getting some GI cocktail as well as pain medication got better.  He was sent home a few days later he had to go to [place] for a business trip he was in a hotel alone and started having burning sensation on the left side of his chest with tingling in his hands.  This sensation gradually got worse he described this as 7-8 in scale up to 10 he ended up going to a hospital in [place] similar evaluation has been done everything was negative he was sent home.  Still not feeling well complaining of being tired and exhausted.  He is very worried about this entire situation.  He is worried that he does have significant heart problem.  Past Medical History:  Diagnosis Date  . Anxiety 04/05/2018  . SVT (supraventricular tachycardia) (HCC)     No past surgical history on file.  Current Medications: Current Meds  Medication Sig  . metoprolol succinate (TOPROL-XL) 50 MG 24 hr tablet Take 0.5 tablet in the morning and 1 whole tablet in evening     Allergies:   Patient has no known allergies.   Social History   Socioeconomic History  . Marital status: Married    Spouse name: Not on file  . Number of children: Not on file  . Years of education: Not on file    . Highest education level: Not on file  Occupational History  . Not on file  Social Needs  . Financial resource strain: Not on file  . Food insecurity:    Worry: Not on file    Inability: Not on file  . Transportation needs:    Medical: Not on file    Non-medical: Not on file  Tobacco Use  . Smoking status: Never Smoker  . Smokeless tobacco: Never Used  Substance and Sexual Activity  . Alcohol use: Yes  . Drug use: No  . Sexual activity: Not on file  Lifestyle  . Physical activity:    Days per week: Not on file    Minutes per session: Not on file  . Stress: Not on file  Relationships  . Social connections:    Talks on phone: Not on file    Gets together: Not on file    Attends religious service: Not on file    Active member of club or organization: Not on file    Attends meetings of clubs or organizations: Not on file    Relationship status: Not on file  Other Topics Concern  . Not on file  Social History Narrative  . Not on file     Family History: The patient's family history is not on file.  ROS:   Please see the history of present illness.    All 14 point review of systems negative except as described per history of present illness  EKGs/Labs/Other Studies Reviewed:      Recent Labs: 10/30/2017: ALT 22; TSH 0.89 03/29/2018: BUN 13; Creatinine, Ser 0.81; Hemoglobin 14.2; Platelets 256; Potassium 3.9; Sodium 135  Recent Lipid Panel No results found for: CHOL, TRIG, HDL, CHOLHDL, VLDL, LDLCALC, LDLDIRECT  Physical Exam:    VS:  BP 110/64   Pulse 72   Ht 6' (1.829 m)   Wt 162 lb 1.9 oz (73.5 kg)   SpO2 98%   BMI 21.99 kg/m     Wt Readings from Last 3 Encounters:  04/05/18 162 lb 1.9 oz (73.5 kg)  03/29/18 162 lb (73.5 kg)  03/07/18 163 lb 1.9 oz (74 kg)     GEN:  Well nourished, well developed in no acute distress HEENT: Normal NECK: No JVD; No carotid bruits LYMPHATICS: No lymphadenopathy CARDIAC: RRR, no murmurs, no rubs, no  gallops RESPIRATORY:  Clear to auscultation without rales, wheezing or rhonchi  ABDOMEN: Soft, non-tender, non-distended MUSCULOSKELETAL:  No edema; No deformity  SKIN: Warm and dry LOWER EXTREMITIES: no swelling NEUROLOGIC:  Alert and oriented x 3 PSYCHIATRIC:  Normal affect   ASSESSMENT:    1. PSVT (paroxysmal supraventricular tachycardia) (HCC)   2. Atypical chest pain   3. Anxiety    PLAN:    In order of problems listed above:  1. Supraventricular tachycardia denies have any recent palpitations on beta-blocker which is controlling his symptoms quite nicely. 2. Atypical chest pain repeated visit to the emergency room 1 and no colitis second in [place] both were negative for myocardial infarction.  No changes on the EKG.  His pain is really atypical lasting for hours biochemical markers negative pain just like some burning I doubt very much that this is related to his heart.  I still think it would be reasonable to check a sedimentation rate to rule out possibility of pericarditis I doubt this very much low.  I am worried that his symptoms are related to anxiety.  I will give him prescription for Xanax 0.25 take it on as-needed basis every 8 hours I will give him only 10 tablets.  However he is so much worry about this sensation did not think it would be reasonable to perform stress test to give him encouragement.  He did have a stress test done in [place] which was done 1-1/2-year ago that test was negative.  Complicates this situation is the fact that he will be going to [place] Monday.  Will try to see if we can do stress that before that time. 3. Because he was told again if pain will get worse or characteristic will be changing to go to the emergency room   Medication Adjustments/Labs and Tests Ordered: Current medicines are reviewed at length with the patient today.  Concerns regarding medicines are outlined above.  No orders of the defined types were placed in this  encounter.  Medication changes: No orders of the defined types were placed in this encounter.   Signed, Georgeanna Lea, MD, [facility] 04/05/2018 10:32 AM    [facility]

## 2018-04-05 NOTE — Patient Instructions (Signed)
Medication Instructions:  Your physician has recommended you make the following change in your medication:  START alprazolam (Xanax) 0.25 mg every 8 hours as needed for anxiety.  Labwork: Your physician recommends that you have the following labs drawn: Sedimentation rate  Testing/Procedures: You had an EKG today.  Your physician has requested that you have an exercise tolerance test. For further information please visit [email or web address]. Please also follow instruction sheet, as given.  Follow-Up: Your physician recommends that you schedule a follow-up appointment in: 3 weeks.  Any Other Special Instructions Will Be Listed Below (If Applicable).     If you need a refill on your cardiac medications before your next appointment, please call your pharmacy.

## 2018-04-05 NOTE — Addendum Note (Signed)
Addended by: Ayesha MohairWELLS, MICHAELA E on: 04/05/2018 10:43 AM   Modules accepted: Orders

## 2018-04-06 LAB — SEDIMENTATION RATE: SED RATE: 2 mm/h (ref 0–15)

## 2018-04-09 ENCOUNTER — Telehealth: Payer: Self-pay | Admitting: Cardiology

## 2018-04-09 NOTE — Telephone Encounter (Signed)
Walter Harvey advised patient of lab results and answered further questions.

## 2018-04-09 NOTE — Telephone Encounter (Signed)
Please call patient, he has questions

## 2018-04-10 ENCOUNTER — Telehealth: Payer: Self-pay | Admitting: Cardiology

## 2018-04-10 MED FILL — ALPRAZolam 0.25 MG TABS: 0.25 | 4 days supply | Qty: 10 | Fill #0

## 2018-04-10 NOTE — Telephone Encounter (Signed)
Patient states that he "has talked with another cardiologist who suggests he have an ultrasound of his carotids arteries." He wants to see if Dr. Bing MatterKrasowski would be on board with this. Will have Dr. Bing MatterKrasowski advise and call patient back.

## 2018-04-10 NOTE — Telephone Encounter (Signed)
Patient would like to speak to you and Dr. Bing MatterKrasowski regarding trying other testing, etc.

## 2018-04-10 NOTE — Telephone Encounter (Signed)
Dr. Bing MatterKrasowski said he would order testing if that is what the patient wants to do. Left message to return call.

## 2018-04-16 ENCOUNTER — Encounter (HOSPITAL_COMMUNITY): Payer: Self-pay

## 2018-04-16 ENCOUNTER — Ambulatory Visit (HOSPITAL_COMMUNITY): Admit: 2018-04-16 | Payer: BLUE CROSS/BLUE SHIELD | Admitting: Cardiology

## 2018-04-16 SURGERY — SVT ABLATION

## 2018-04-25 ENCOUNTER — Encounter (INDEPENDENT_AMBULATORY_CARE_PROVIDER_SITE_OTHER): Payer: Self-pay

## 2018-04-25 ENCOUNTER — Ambulatory Visit (INDEPENDENT_AMBULATORY_CARE_PROVIDER_SITE_OTHER): Payer: BLUE CROSS/BLUE SHIELD

## 2018-04-25 DIAGNOSIS — R0789 Other chest pain: Secondary | ICD-10-CM

## 2018-04-25 LAB — EXERCISE TOLERANCE TEST
Estimated workload: 17.2 METS
Exercise duration (min): 14 min
Exercise duration (sec): 0 s
MPHR: 182 {beats}/min
Peak HR: 184 {beats}/min
Percent HR: 101 %
RPE: 15
Rest HR: 72 {beats}/min

## 2018-04-26 ENCOUNTER — Ambulatory Visit: Payer: BLUE CROSS/BLUE SHIELD | Admitting: Cardiology

## 2018-04-26 ENCOUNTER — Encounter: Payer: Self-pay | Admitting: Cardiology

## 2018-04-26 VITALS — BP 110/68 | HR 73 | Ht 72.0 in | Wt 163.0 lb

## 2018-04-26 DIAGNOSIS — R0789 Other chest pain: Secondary | ICD-10-CM

## 2018-04-26 DIAGNOSIS — I493 Ventricular premature depolarization: Secondary | ICD-10-CM | POA: Diagnosis not present

## 2018-04-26 DIAGNOSIS — F419 Anxiety disorder, unspecified: Secondary | ICD-10-CM

## 2018-04-26 DIAGNOSIS — I471 Supraventricular tachycardia: Secondary | ICD-10-CM | POA: Diagnosis not present

## 2018-04-26 NOTE — Progress Notes (Signed)
Cardiology Office Note:    Date:  04/26/2018   ID:  Walter Harvey, DOB July 12, 1981, MRN [id number]  PCP:  Esperanza Richters, PA-C  Cardiologist:  Gypsy Balsam, MD    Referring MD: Marisue Brooklyn   Chief Complaint  Patient presents with  . 3 week follow up  Still having some chest pain but better  History of Present Illness:    Walter Harvey is a 37 y.o. male with supraventricular tachycardia as well as some PVCs.  Lately he been struggling with some atypical chest pain.  Pain is not related to exercise typically happen at rest interestingly he went to [place] with his family for 3 Days He Walk a lot he had a good time he did not have any chest pain during the time.  He did have a stress test though I have very low level suspicion that his pain is related to his heart.  Stress test was negative.  Denies having recent sustained tachycardias takes 50 mg metoprolol succinate once a day which seems to be controlling the rhythm he was scheduled to have SVT ablation however canceled it.  He contacted some physician from [place] that is in [place] and then he was recommended to go to [facility] to have ablation.  He made already arrangements for that and he will go there for initial visit on Monday.  Past Medical History:  Diagnosis Date  . Anxiety 04/05/2018  . SVT (supraventricular tachycardia) (HCC)     No past surgical history on file.  Current Medications: Current Meds  Medication Sig  . ALPRAZolam (XANAX) 0.25 MG tablet Take 1 tablet (0.25 mg total) by mouth every 8 (eight) hours as needed for anxiety.  . metoprolol succinate (TOPROL-XL) 50 MG 24 hr tablet Take 0.5 tablet in the morning and 1 whole tablet in evening     Allergies:   Patient has no known allergies.   Social History   Socioeconomic History  . Marital status: Married    Spouse name: Not on file  . Number of children: Not on file  . Years of education: Not on file  . Highest education level:  Not on file  Occupational History  . Not on file  Social Needs  . Financial resource strain: Not on file  . Food insecurity:    Worry: Not on file    Inability: Not on file  . Transportation needs:    Medical: Not on file    Non-medical: Not on file  Tobacco Use  . Smoking status: Never Smoker  . Smokeless tobacco: Never Used  Substance and Sexual Activity  . Alcohol use: Yes  . Drug use: No  . Sexual activity: Not on file  Lifestyle  . Physical activity:    Days per week: Not on file    Minutes per session: Not on file  . Stress: Not on file  Relationships  . Social connections:    Talks on phone: Not on file    Gets together: Not on file    Attends religious service: Not on file    Active member of club or organization: Not on file    Attends meetings of clubs or organizations: Not on file    Relationship status: Not on file  Other Topics Concern  . Not on file  Social History Narrative  . Not on file     Family History: The patient's family history is not on file. ROS:   Please see the history of  present illness.    All 14 point review of systems negative except as described per history of present illness  EKGs/Labs/Other Studies Reviewed:      Recent Labs: 10/30/2017: ALT 22; TSH 0.89 03/29/2018: BUN 13; Creatinine, Ser 0.81; Hemoglobin 14.2; Platelets 256; Potassium 3.9; Sodium 135  Recent Lipid Panel No results found for: CHOL, TRIG, HDL, CHOLHDL, VLDL, LDLCALC, LDLDIRECT  Physical Exam:    VS:  BP 110/68   Pulse 73   Ht 6' (1.829 m)   Wt 163 lb (73.9 kg)   SpO2 98%   BMI 22.11 kg/m     Wt Readings from Last 3 Encounters:  04/26/18 163 lb (73.9 kg)  04/05/18 162 lb 1.9 oz (73.5 kg)  03/29/18 162 lb (73.5 kg)     GEN:  Well nourished, well developed in no acute distress HEENT: Normal NECK: No JVD; No carotid bruits LYMPHATICS: No lymphadenopathy CARDIAC: RRR, no murmurs, no rubs, no gallops RESPIRATORY:  Clear to auscultation without  rales, wheezing or rhonchi  ABDOMEN: Soft, non-tender, non-distended MUSCULOSKELETAL:  No edema; No deformity  SKIN: Warm and dry LOWER EXTREMITIES: no swelling NEUROLOGIC:  Alert and oriented x 3 PSYCHIATRIC:  Normal affect   ASSESSMENT:    1. PSVT (paroxysmal supraventricular tachycardia) (HCC)   2. PVC's (premature ventricular contractions)   3. Anxiety   4. Atypical chest pain    PLAN:    In order of problems listed above:  1. Supraventricular tachycardia: Scheduled to see [facility] clinic Monday will wait for results of the visit. 2. PVCs: Denies having any lately. 3. Anxiety: In my opinion this is 1 of the leading diagnosis now.  His symptomatology is most likely related to this problem.  I gave him some alprazolam to see if that helps and he said that he used twice first time it helps a great deal second time not much.  I think we need to finish dealing with his arrhythmia and then he may require that visit in psychotherapy and psychology. 4. Atypical chest pain: Again I suspect this is more anxiety type pain stress test was done showed no evidence of ischemia.  He will return to me in 2 months after he come back from [place] clinic hopefully his SVT will be taking care of by that time.   Medication Adjustments/Labs and Tests Ordered: Current medicines are reviewed at length with the patient today.  Concerns regarding medicines are outlined above.  No orders of the defined types were placed in this encounter.  Medication changes: No orders of the defined types were placed in this encounter.   Signed, Georgeanna Lea, MD, [facility] 04/26/2018 1:44 PM    [facility]

## 2018-04-26 NOTE — Patient Instructions (Signed)
Medication Instructions:   Your physician recommends that you continue on your current medications as directed. Please refer to the Current Medication list given to you today.  Labwork:  None  Testing/Procedures:  None  Follow-Up:  Your physician recommends that you schedule a follow-up appointment in: 2 months.  Any Other Special Instructions Will Be Listed Below (If Applicable).  If you need a refill on your cardiac medications before your next appointment, please call your pharmacy. 

## 2018-06-04 ENCOUNTER — Telehealth: Payer: Self-pay | Admitting: Cardiology

## 2018-06-04 ENCOUNTER — Other Ambulatory Visit: Payer: Self-pay | Admitting: *Deleted

## 2018-06-04 MED ORDER — ALPRAZOLAM 0.25 MG PO TABS: 0.2500 mg | ORAL_TABLET | Freq: Three times a day (TID) | ORAL | 0 refills | Status: DC | PRN

## 2018-06-04 MED FILL — ALPRAZolam 0.25 MG TABS: 0.25 | 2 days supply | Qty: 5 | Fill #0

## 2018-06-04 NOTE — Telephone Encounter (Signed)
Patient came to office requested xanax 0.25 mg PRN refill as well. Dr. Bing MatterKrasowski refilled medication for 5 tablets only with no refills. Patient given paper prescription. No further questions.

## 2018-06-04 NOTE — Telephone Encounter (Signed)
Patient needs refill of alprazolem .25mg  tablet to [facility]

## 2018-06-14 ENCOUNTER — Encounter: Payer: Self-pay | Admitting: Cardiology

## 2018-06-14 ENCOUNTER — Ambulatory Visit (INDEPENDENT_AMBULATORY_CARE_PROVIDER_SITE_OTHER): Payer: BLUE CROSS/BLUE SHIELD | Admitting: Cardiology

## 2018-06-14 VITALS — BP 120/68 | HR 77 | Ht 72.0 in | Wt 164.0 lb

## 2018-06-14 DIAGNOSIS — R0789 Other chest pain: Secondary | ICD-10-CM

## 2018-06-14 DIAGNOSIS — R002 Palpitations: Secondary | ICD-10-CM | POA: Diagnosis not present

## 2018-06-14 DIAGNOSIS — I4719 Other supraventricular tachycardia: Secondary | ICD-10-CM | POA: Insufficient documentation

## 2018-06-14 DIAGNOSIS — I471 Supraventricular tachycardia: Secondary | ICD-10-CM | POA: Diagnosis not present

## 2018-06-14 DIAGNOSIS — F419 Anxiety disorder, unspecified: Secondary | ICD-10-CM | POA: Diagnosis not present

## 2018-06-14 MED ORDER — METOPROLOL SUCCINATE ER 50 MG PO TB24: ORAL_TABLET | ORAL | 6 refills | Status: DC

## 2018-06-14 MED ORDER — ALPRAZOLAM 0.25 MG PO TABS: 0.2500 mg | ORAL_TABLET | Freq: Three times a day (TID) | ORAL | 0 refills | Status: DC | PRN

## 2018-06-14 NOTE — Patient Instructions (Addendum)
Medication Instructions:  Your physician recommends that you continue on your current medications as directed. Please refer to the Current Medication list given to you today.  **Take metoprolol succinate as needed  You will need to contact you Primary care physician in regards to taking over your Alprazolam prescription.  Labwork: None ordered  Testing/Procedures: Your physician has requested that you have an echocardiogram. Echocardiography is a painless test that uses sound waves to create images of your heart. It provides your doctor with information about the size and shape of your heart and how well your heart's chambers and valves are working. This procedure takes approximately one hour. There are no restrictions for this procedure.  Your physician has recommended that you wear a holter monitor. Holter monitors are medical devices that record the heart's electrical activity. Doctors most often use these monitors to diagnose arrhythmias. Arrhythmias are problems with the speed or rhythm of the heartbeat. The monitor is a small, portable device. You can wear one while you do your normal daily activities. This is usually used to diagnose what is causing palpitations/syncope (passing out). You will wear this for 30 days.   Follow-Up: Your physician recommends that you schedule a follow-up appointment in: 6 weeks follow up with Dr. Bing MatterKrasowski.    Any Other Special Instructions Will Be Listed Below (If Applicable).     If you need a refill on your cardiac medications before your next appointment, please call your pharmacy.

## 2018-06-14 NOTE — Progress Notes (Signed)
Cardiology Office Note:    Date:  06/14/2018   ID:  Walter Harvey, DOB [birth year], MRN [id number]  PCP:  Esperanza RichtersSaguier, Edward, PA-C  Cardiologist:  Gypsy Balsamobert Krasowski, MD    Referring MD: Marisue BrooklynSaguier, Edward, PA-C   No chief complaint on file. I have palpitations again  History of Present Illness:    Walter ParentsMarcus Fager is a 37 y.o. male with palpitations he was identified to have atrial tachycardia that was ablated 1 month and a half ago [facility].  He felt great for about a month and now he started having palpitations again he said somewhat different scenarios still heart spitting up abrupt onset abrupt offset with high anxiety associated with it.  Also described to have some atypical chest pain as well as some mild shortness of breath.  He try to exercise on the bike however he could not because of fatigue and tiredness.  Somewhat disappointed but I explained to him that does not mean that ablation fail, will put event recorder on him for 30 days to see exactly what kind of arrhythmia with dealing with now.  I will ask him to restart metoprolol after will identify what kind of arrhythmia we have also give him some extra Xanax only 10 tablets.  He will be scheduled to have echocardiogram to assess left ventricular ejection fraction make sure there is no complication of ablation however it is late to have complication of this procedure.  Past Medical History:  Diagnosis Date  . Anxiety 04/05/2018  . SVT (supraventricular tachycardia) (HCC)     History reviewed. No pertinent surgical history.  Current Medications: Current Meds  Medication Sig  . ALPRAZolam (XANAX) 0.25 MG tablet Take 1 tablet (0.25 mg total) by mouth every 8 (eight) hours as needed for anxiety.     Allergies:   Patient has no known allergies.   Social History   Socioeconomic History  . Marital status: Married    Spouse name: Not on file  . Number of children: Not on file  . Years of education: Not on file  . Highest  education level: Not on file  Occupational History  . Not on file  Social Needs  . Financial resource strain: Not on file  . Food insecurity:    Worry: Not on file    Inability: Not on file  . Transportation needs:    Medical: Not on file    Non-medical: Not on file  Tobacco Use  . Smoking status: Never Smoker  . Smokeless tobacco: Never Used  Substance and Sexual Activity  . Alcohol use: Yes  . Drug use: No  . Sexual activity: Not on file  Lifestyle  . Physical activity:    Days per week: Not on file    Minutes per session: Not on file  . Stress: Not on file  Relationships  . Social connections:    Talks on phone: Not on file    Gets together: Not on file    Attends religious service: Not on file    Active member of club or organization: Not on file    Attends meetings of clubs or organizations: Not on file    Relationship status: Not on file  Other Topics Concern  . Not on file  Social History Narrative  . Not on file     Family History: The patient's family history is not on file. ROS:   Please see the history of present illness.    All 14 point review of systems  negative except as described per history of present illness  EKGs/Labs/Other Studies Reviewed:      Recent Labs: 10/30/2017: ALT 22; TSH 0.89 03/29/2018: BUN 13; Creatinine, Ser 0.81; Hemoglobin 14.2; Platelets 256; Potassium 3.9; Sodium 135  Recent Lipid Panel No results found for: CHOL, TRIG, HDL, CHOLHDL, VLDL, LDLCALC, LDLDIRECT  Physical Exam:    VS:  BP 120/68 (BP Location: Right Arm, Patient Position: Sitting, Cuff Size: Normal)   Pulse 77   Ht 6' (1.829 m)   Wt 164 lb (74.4 kg)   SpO2 98%   BMI 22.24 kg/m     Wt Readings from Last 3 Encounters:  06/14/18 164 lb (74.4 kg)  04/26/18 163 lb (73.9 kg)  04/05/18 162 lb 1.9 oz (73.5 kg)     GEN:  Well nourished, well developed in no acute distress HEENT: Normal NECK: No JVD; No carotid bruits LYMPHATICS: No lymphadenopathy CARDIAC:  RRR, no murmurs, no rubs, no gallops RESPIRATORY:  Clear to auscultation without rales, wheezing or rhonchi  ABDOMEN: Soft, non-tender, non-distended MUSCULOSKELETAL:  No edema; No deformity  SKIN: Warm and dry LOWER EXTREMITIES: no swelling NEUROLOGIC:  Alert and oriented x 3 PSYCHIATRIC:  Normal affect   ASSESSMENT:    1. Palpitations   2. Atrial tachycardia (HCC)   3. Atypical chest pain   4. Anxiety    PLAN:    In order of problems listed above:  1. Palpitations, status post atrial tachycardia ablation.  Plan as outlined above.  We will give her metoprolol at the same dose that he was taking before however asked him not to take it until record arrhythmia on the event recorder.  Echocardiogram will be scheduled. 2. Anxiety I will give him 10 tablets of 0.25 mg of Xanax.  So far he is only 10 tablets within.  Of last 2 months.  He is come to me with his wife today and his wife is very much to lateral medicine we were talking about meditation yoga acupuncture I recommend all those measures to help to deal with anxiety. 3. Typical chest pain.  Recent stress test negative.   Medication Adjustments/Labs and Tests Ordered: Current medicines are reviewed at length with the patient today.  Concerns regarding medicines are outlined above.  No orders of the defined types were placed in this encounter.  Medication changes: No orders of the defined types were placed in this encounter.   Signed, Georgeanna Lea, MD, [facility] 06/14/2018 3:09 PM    [facility]

## 2018-06-29 MED FILL — METOPROLOL SUCCINATE ER 50: 50 | 30 days supply | Qty: 45 | Fill #0

## 2018-07-04 ENCOUNTER — Other Ambulatory Visit (HOSPITAL_BASED_OUTPATIENT_CLINIC_OR_DEPARTMENT_OTHER): Payer: BLUE CROSS/BLUE SHIELD

## 2018-07-05 ENCOUNTER — Ambulatory Visit: Payer: BLUE CROSS/BLUE SHIELD

## 2018-07-05 ENCOUNTER — Ambulatory Visit (HOSPITAL_BASED_OUTPATIENT_CLINIC_OR_DEPARTMENT_OTHER)
Admission: RE | Admit: 2018-07-05 | Discharge: 2018-07-05 | Disposition: A | Discharge status: Home / Self Care | Payer: BLUE CROSS/BLUE SHIELD | Source: Ambulatory Visit | Attending: Cardiology | Admitting: Cardiology

## 2018-07-05 DIAGNOSIS — R002 Palpitations: Secondary | ICD-10-CM | POA: Diagnosis not present

## 2018-07-05 DIAGNOSIS — R0789 Other chest pain: Secondary | ICD-10-CM | POA: Insufficient documentation

## 2018-07-05 DIAGNOSIS — I471 Supraventricular tachycardia: Secondary | ICD-10-CM | POA: Diagnosis not present

## 2018-07-05 NOTE — Progress Notes (Signed)
  Echocardiogram 2D Echocardiogram has been performed.  Kiaria Quinnell T Tekeshia Klahr 07/05/2018, 11:40 AM

## 2018-07-06 ENCOUNTER — Encounter (INDEPENDENT_AMBULATORY_CARE_PROVIDER_SITE_OTHER): Payer: Self-pay

## 2018-08-14 ENCOUNTER — Ambulatory Visit: Payer: BLUE CROSS/BLUE SHIELD | Admitting: Cardiology

## 2018-08-27 ENCOUNTER — Encounter: Payer: Self-pay | Admitting: Cardiology

## 2018-08-27 ENCOUNTER — Ambulatory Visit: Payer: BLUE CROSS/BLUE SHIELD | Admitting: Cardiology

## 2018-08-27 VITALS — BP 126/74 | HR 76 | Ht 72.0 in | Wt 164.0 lb

## 2018-08-27 DIAGNOSIS — F419 Anxiety disorder, unspecified: Secondary | ICD-10-CM | POA: Diagnosis not present

## 2018-08-27 DIAGNOSIS — I471 Supraventricular tachycardia: Secondary | ICD-10-CM

## 2018-08-27 DIAGNOSIS — I493 Ventricular premature depolarization: Secondary | ICD-10-CM | POA: Diagnosis not present

## 2018-08-27 DIAGNOSIS — R0789 Other chest pain: Secondary | ICD-10-CM

## 2018-08-27 NOTE — Progress Notes (Signed)
Cardiology Office Note:    Date:  08/27/2018   ID:  Walter Harvey, DOB 1981-12-04, MRN [id number]  PCP:  Esperanza Richters, PA-C  Cardiologist:  Gypsy Balsam, MD    Referring MD: Marisue Brooklyn   Chief Complaint  Patient presents with  . 6 week follow up  Doing better  History of Present Illness:    Walter Harvey is a 37 y.o. male with history of supraventricular tachycardia and atrial tachycardia that is being ablated in [place] [facility] initially he was doing great however after that he started complaining of having some palpitations echocardiogram was done which showed normal left ventricular ejection fraction also he wear a 30 days event recorder which showed only some ventricular and supraventricular ectopy that he felt as palpitations.  There was no sustained arrhythmia.  Overall he is doing well he is started exercising back he is able to ride bike and have no difficulty doing it still describe to have some pain in the left side of his chest that he happening at rest interestingly he clearly stated that he feels best when he is riding his bike.  He is planning trip to [place] to continue doing  biking.  And I told him I have absolutely no objections against him doing it.  He does not take any beta-blocker told him if you have more palpitations this will be reasonable to restart beta-blocker.  Past Medical History:  Diagnosis Date  . Anxiety 04/05/2018  . SVT (supraventricular tachycardia) (HCC)     No past surgical history on file.  Current Medications: No outpatient medications have been marked as taking for the 08/27/18 encounter (Office Visit) with Georgeanna Lea, MD.     Allergies:   Patient has no known allergies.   Social History   Socioeconomic History  . Marital status: Married    Spouse name: Not on file  . Number of children: Not on file  . Years of education: Not on file  . Highest education level: Not on file  Occupational History  . Not on file    Social Needs  . Financial resource strain: Not on file  . Food insecurity:    Worry: Not on file    Inability: Not on file  . Transportation needs:    Medical: Not on file    Non-medical: Not on file  Tobacco Use  . Smoking status: Never Smoker  . Smokeless tobacco: Never Used  Substance and Sexual Activity  . Alcohol use: Yes  . Drug use: No  . Sexual activity: Not on file  Lifestyle  . Physical activity:    Days per week: Not on file    Minutes per session: Not on file  . Stress: Not on file  Relationships  . Social connections:    Talks on phone: Not on file    Gets together: Not on file    Attends religious service: Not on file    Active member of club or organization: Not on file    Attends meetings of clubs or organizations: Not on file    Relationship status: Not on file  Other Topics Concern  . Not on file  Social History Narrative  . Not on file     Family History: The patient's family history is not on file. ROS:   Please see the history of present illness.    All 14 point review of systems negative except as described per history of present illness  EKGs/Labs/Other Studies Reviewed:  Recent Labs: 10/30/2017: ALT 22; TSH 0.89 03/29/2018: BUN 13; Creatinine, Ser 0.81; Hemoglobin 14.2; Platelets 256; Potassium 3.9; Sodium 135  Recent Lipid Panel No results found for: CHOL, TRIG, HDL, CHOLHDL, VLDL, LDLCALC, LDLDIRECT  Physical Exam:    VS:  BP 126/74   Pulse 76   Ht 6' (1.829 m)   Wt 164 lb (74.4 kg)   SpO2 98%   BMI 22.24 kg/m     Wt Readings from Last 3 Encounters:  08/27/18 164 lb (74.4 kg)  06/14/18 164 lb (74.4 kg)  04/26/18 163 lb (73.9 kg)     GEN:  Well nourished, well developed in no acute distress HEENT: Normal NECK: No JVD; No carotid bruits LYMPHATICS: No lymphadenopathy CARDIAC: RRR, no murmurs, no rubs, no gallops RESPIRATORY:  Clear to auscultation without rales, wheezing or rhonchi  ABDOMEN: Soft, non-tender,  non-distended MUSCULOSKELETAL:  No edema; No deformity  SKIN: Warm and dry LOWER EXTREMITIES: no swelling NEUROLOGIC:  Alert and oriented x 3 PSYCHIATRIC:  Normal affect   ASSESSMENT:    1. PSVT (paroxysmal supraventricular tachycardia) (HCC)   2. Atrial tachycardia (HCC)   3. PVC's (premature ventricular contractions)   4. Anxiety   5. Atypical chest pain    PLAN:    In order of problems listed above:  1. Supraventricular tachycardia with atrial tachycardia ablation doing well from that point review we will continue present management. 2. PVCs I advised him to start taking back beta-blocker if palpitations become bothersome. 3. Anxiety he change some of his schedule he sleeps a little bit longer he started going to gym on the regular basis and he is enjoying it however he does not want to take Xanax anymore.  Overall he is doing well 4. Atypical chest pain.  Very atypical not related to exercise.   Medication Adjustments/Labs and Tests Ordered: Current medicines are reviewed at length with the patient today.  Concerns regarding medicines are outlined above.  No orders of the defined types were placed in this encounter.  Medication changes: No orders of the defined types were placed in this encounter.   Signed, Georgeanna Lea, MD, [facility] 08/27/2018 1:25 PM    [facility]

## 2018-08-27 NOTE — Patient Instructions (Signed)

## 2019-03-10 IMAGING — CR DG CHEST 2V
2 series · 2 of 2 positions shown · non-contrast
Comparison: None.

CLINICAL DATA: 36-year-old male with left side chest pain since
yesterday.

EXAM:
CHEST - 2 VIEW

[w chest pa]
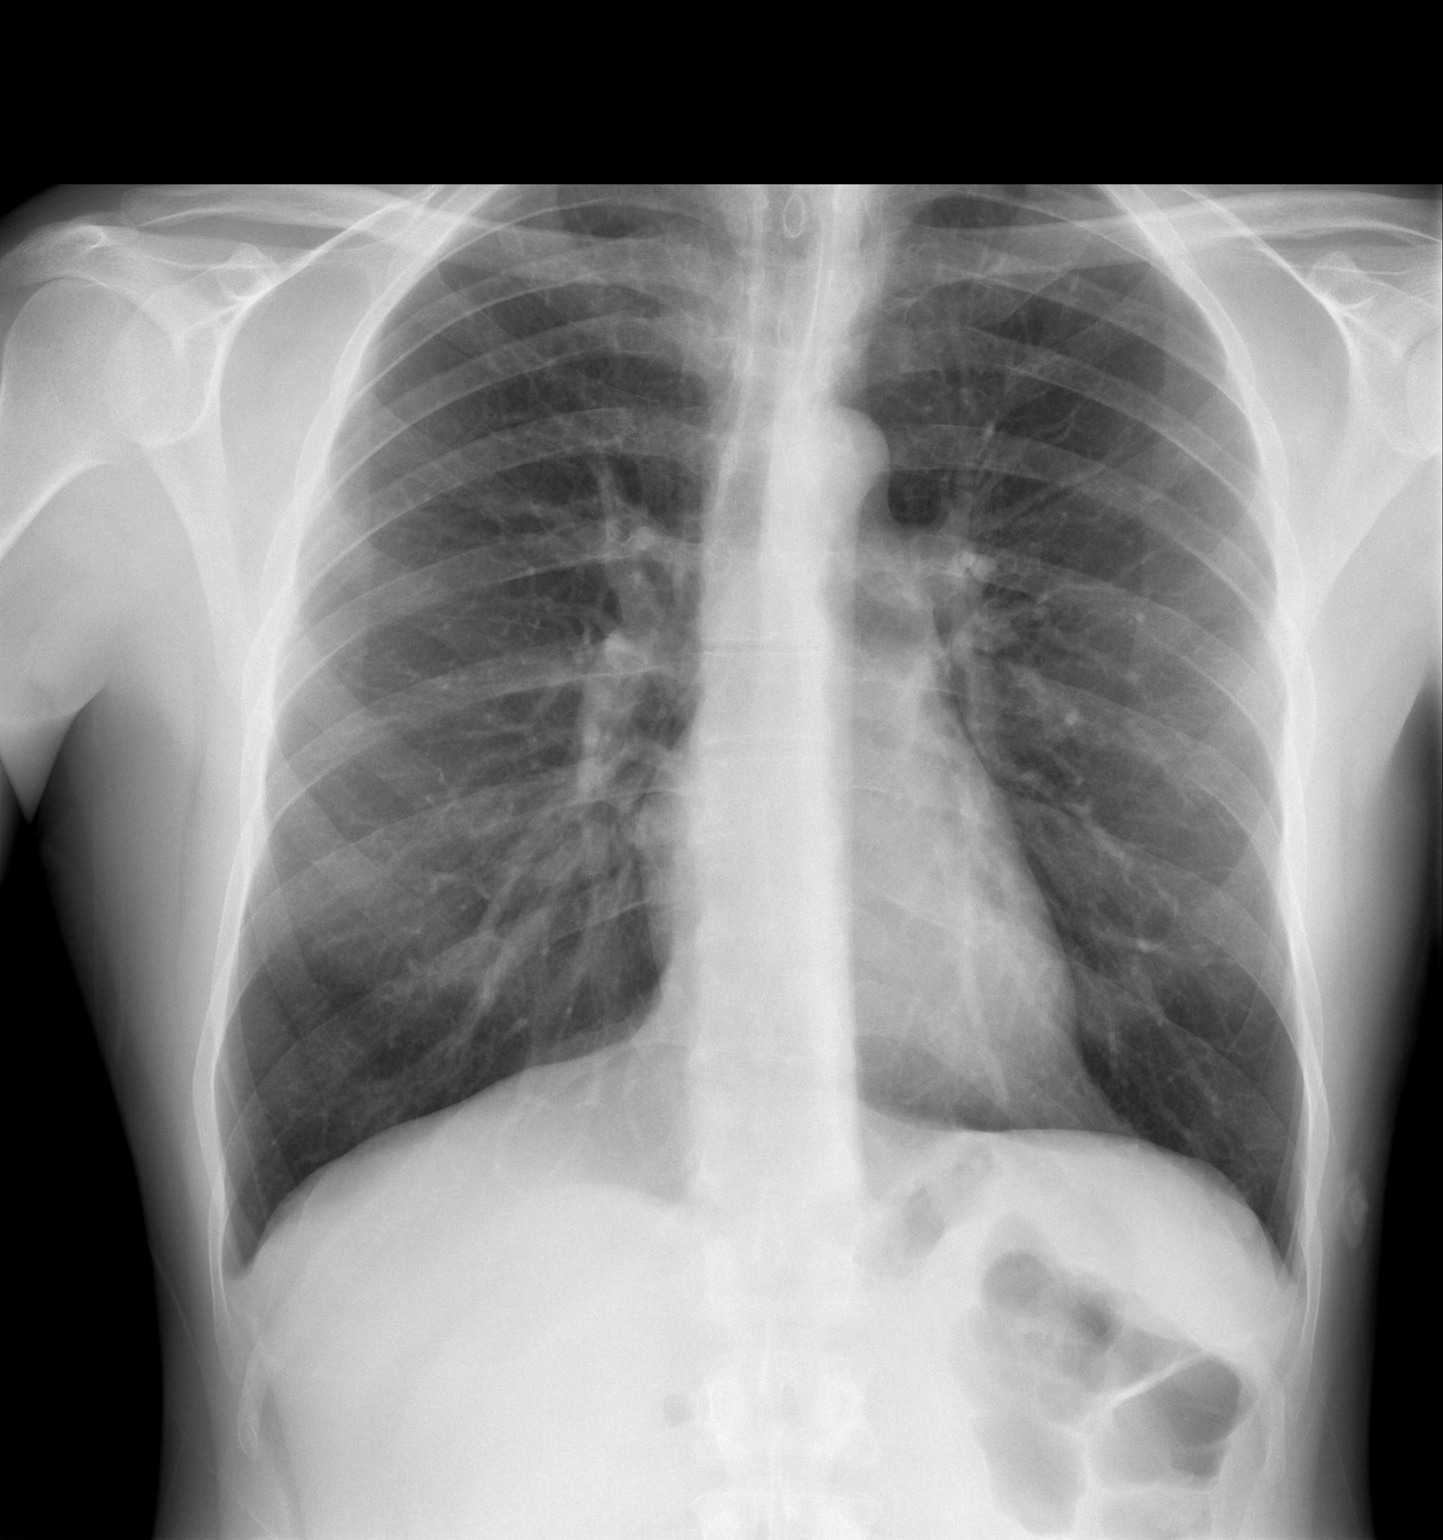

[w chest lat]
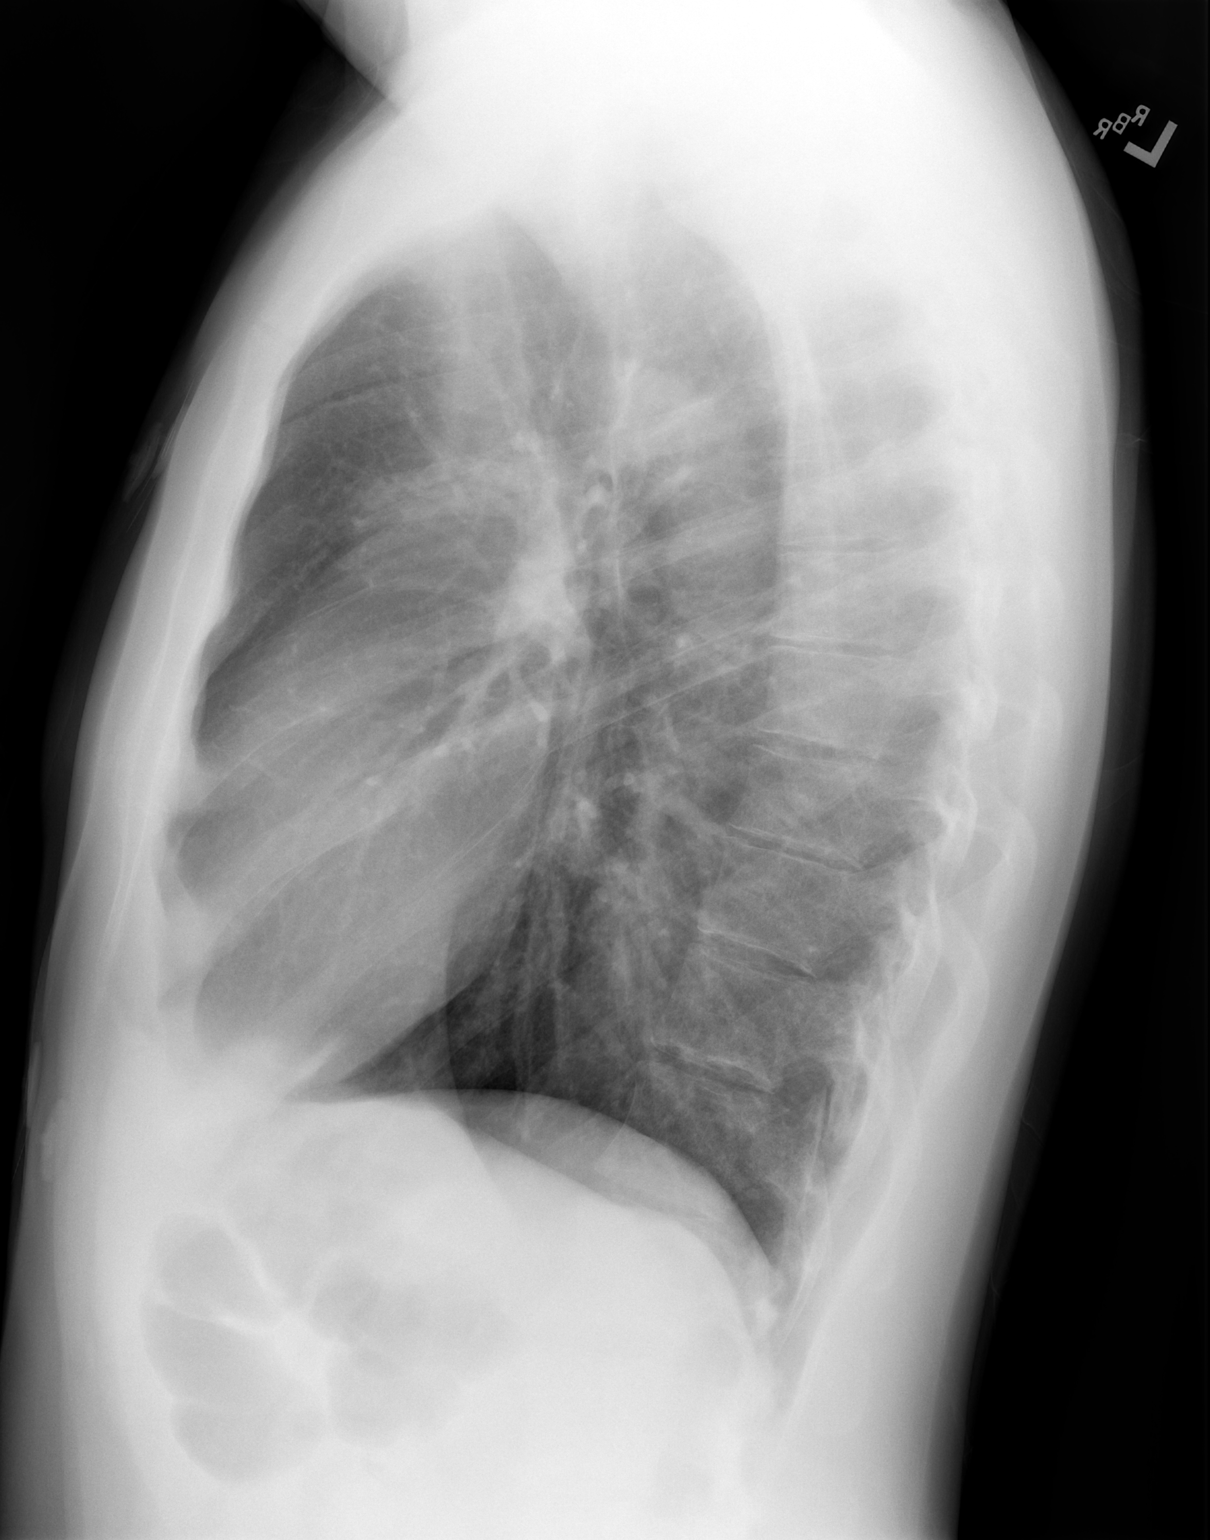

[2 of 2 positions shown; findings below may reference images not displayed]

FINDINGS: Normal lung volumes. Normal cardiac size and mediastinal contours.
Visualized tracheal air column is within normal limits. Both lungs
appear clear. No pneumothorax or pleural effusion. Minor
dextroconvex thoracic spine curvature. No other osseous abnormality
identified. Negative visible bowel gas pattern.
IMPRESSION: Negative.  No acute cardiopulmonary abnormality.

## 2019-12-19 ENCOUNTER — Telehealth: Payer: Self-pay | Admitting: Medical

## 2019-12-19 ENCOUNTER — Ambulatory Visit (INDEPENDENT_AMBULATORY_CARE_PROVIDER_SITE_OTHER): Payer: BC Managed Care – PPO | Admitting: Medical

## 2019-12-19 ENCOUNTER — Other Ambulatory Visit: Payer: Self-pay

## 2019-12-19 ENCOUNTER — Encounter: Payer: Self-pay | Admitting: Medical

## 2019-12-19 ENCOUNTER — Ambulatory Visit (HOSPITAL_BASED_OUTPATIENT_CLINIC_OR_DEPARTMENT_OTHER)
Admission: RE | Admit: 2019-12-19 | Discharge: 2019-12-19 | Disposition: A | Discharge status: Home / Self Care | Payer: BC Managed Care – PPO | Source: Ambulatory Visit | Attending: Medical | Admitting: Medical

## 2019-12-19 VITALS — BP 98/60 | HR 69

## 2019-12-19 DIAGNOSIS — M25512 Pain in left shoulder: Secondary | ICD-10-CM

## 2019-12-19 DIAGNOSIS — G8929 Other chronic pain: Secondary | ICD-10-CM | POA: Diagnosis not present

## 2019-12-19 DIAGNOSIS — Z0001 Encounter for general adult medical examination with abnormal findings: Secondary | ICD-10-CM

## 2019-12-19 DIAGNOSIS — R5383 Other fatigue: Secondary | ICD-10-CM | POA: Diagnosis not present

## 2019-12-19 DIAGNOSIS — Z0000 Encounter for general adult medical examination without abnormal findings: Secondary | ICD-10-CM

## 2019-12-19 LAB — CBC WITH DIFFERENTIAL/PLATELET
Basophils Absolute: 0 10*3/uL (ref 0.0–0.1)
Basophils Relative: 0.2 % (ref 0.0–3.0)
Eosinophils Absolute: 0.1 10*3/uL (ref 0.0–0.7)
Eosinophils Relative: 1.5 % (ref 0.0–5.0)
HCT: 42.5 % (ref 39.0–52.0)
Hemoglobin: 14.7 g/dL (ref 13.0–17.0)
Lymphocytes Relative: 33.5 % (ref 12.0–46.0)
Lymphs Abs: 1.5 10*3/uL (ref 0.7–4.0)
MCHC: 34.5 g/dL (ref 30.0–36.0)
MCV: 95.2 fl (ref 78.0–100.0)
Monocytes Absolute: 0.5 10*3/uL (ref 0.1–1.0)
Monocytes Relative: 10.5 % (ref 3.0–12.0)
Neutro Abs: 2.4 10*3/uL (ref 1.4–7.7)
Neutrophils Relative %: 54.3 % (ref 43.0–77.0)
Platelets: 245 10*3/uL (ref 150.0–400.0)
RBC: 4.47 Mil/uL (ref 4.22–5.81)
RDW: 12.9 % (ref 11.5–15.5)
WBC: 4.4 10*3/uL (ref 4.0–10.5)

## 2019-12-19 LAB — COMPREHENSIVE METABOLIC PANEL
ALT: 16 U/L (ref 0–53)
AST: 15 U/L (ref 0–37)
Albumin: 4.7 g/dL (ref 3.5–5.2)
Alkaline Phosphatase: 43 U/L (ref 39–117)
BUN: 13 mg/dL (ref 6–23)
CO2: 30 mEq/L (ref 19–32)
Calcium: 9.3 mg/dL (ref 8.4–10.5)
Chloride: 103 mEq/L (ref 96–112)
Creatinine, Ser: 0.87 mg/dL (ref 0.40–1.50)
GFR: 97.06 mL/min (ref >60.00)
Glucose, Bld: 104 mg/dL — ABNORMAL HIGH (ref 70–99)
Potassium: 4.6 mEq/L (ref 3.5–5.1)
Sodium: 138 mEq/L (ref 135–145)
Total Bilirubin: 1.2 mg/dL (ref 0.2–1.2)
Total Protein: 7 g/dL (ref 6.0–8.3)

## 2019-12-19 LAB — LIPID PANEL
Cholesterol: 180 mg/dL (ref 0–200)
HDL: 50.9 mg/dL (ref >39.00)
LDL Cholesterol: 117 mg/dL — ABNORMAL HIGH (ref 0–99)
NonHDL: 129.48
Total CHOL/HDL Ratio: 4
Triglycerides: 62 mg/dL (ref 0.0–149.0)
VLDL: 12.4 mg/dL (ref 0.0–40.0)

## 2019-12-19 LAB — VITAMIN B12: Vitamin B-12: 471 pg/mL (ref 211–911)

## 2019-12-19 NOTE — Progress Notes (Signed)
Subjective:    Patient ID: Walter Harvey, male    DOB: 23-Aug-1981, 38 y.o.   MRN: [id number]  HPI  Virtual Visit via Video Note  I connected with Walter Harvey on 12/19/19 at  9:00 AM EST by a video enabled telemedicine application and verified that I am speaking with the correct person using two identifiers.  Location: Patient: home Provider: office   I discussed the limitations of evaluation and management by telemedicine and the availability of in person appointments. The patient expressed understanding and agreed to proceed.  History of Present Illness:  Pt has cpe today.   Pt works at [employer], married has 3 children. Some regular exercise at home. Married and has 3 children.   Pt psvt is now resolved. He went through   Pt also mentions some lt shoulder pain. Started in the summer. Pain on movement. Hurts to lift arm/abduct. He states pain seemed to start around [place]      Pt is up to date on flu vaccine.      Observations/Objective:(exam in person since he came in for labs)  General-no acute distress, pleasant, oriented. Lungs- on inspection lungs appear unlabored. Neck- no tracheal deviation or jvd on inspection. Neuro- gross motor function appears intact. Left shoulder- no crepitus on rom. No warmth on palpation. Pain abduction.   Assessment and Plan: For you wellness exam today I have ordered cbc, cmp, tsh, lipid panel, ua and hiv.  Vaccine given today.   For mild fatigue will add b1, b12 and vit d level.  For left shouder pain, will get xray. After review xray may refer to sport med to evaluate rotator cuff.  Recommend exercise and healthy diet.  We will let you know lab results as they come in.  Follow up date appointment will be determined after lab review.   Follow Up Instructions:    I discussed the assessment and treatment plan with the patient. The patient was provided an opportunity to ask questions and all were  answered. The patient agreed with the plan and demonstrated an understanding of the instructions.   The patient was advised to call back or seek an in-person evaluation if the symptoms worsen or if the condition fails to improve as anticipated.  99833 charge as discussed and addressed shoulder pain     Esperanza Richters, PA-C    Review of Systems  Constitutional: Positive for fatigue. Negative for chills and fever.       Mild fatigue  HENT: Negative for dental problem.   Respiratory: Negative for cough, chest tightness, shortness of breath and wheezing.   Cardiovascular: Negative for chest pain and palpitations.       Pt states had ablation procedure at [facility]. Svt resolved.  Gastrointestinal: Negative for abdominal pain, diarrhea, nausea and vomiting.  Genitourinary: Negative for difficulty urinating, dysuria, hematuria, testicular pain and urgency.  Musculoskeletal: Negative for back pain, myalgias and neck stiffness.       See hpi shoulder pain.  Skin: Negative for rash.  Neurological: Negative for dizziness, numbness and headaches.  Psychiatric/Behavioral: Negative for behavioral problems and confusion.   Past Medical History:  Diagnosis Date  . Anxiety 04/05/2018  . SVT (supraventricular tachycardia) (HCC)      Social History   Socioeconomic History  . Marital status: Married    Spouse name: Not on file  . Number of children: Not on file  . Years of education: Not on file  . Highest  education level: Not on file  Occupational History  . Not on file  Tobacco Use  . Smoking status: Never Smoker  . Smokeless tobacco: Never Used  Substance and Sexual Activity  . Alcohol use: Yes  . Drug use: No  . Sexual activity: Not on file  Other Topics Concern  . Not on file  Social History Narrative  . Not on file   Social Determinants of Health   Financial Resource Strain:   . Difficulty of Paying Living Expenses: Not on file  Food Insecurity:   . Worried About  Charity fundraiser in the Last Year: Not on file  . Ran Out of Food in the Last Year: Not on file  Transportation Needs:   . Lack of Transportation (Medical): Not on file  . Lack of Transportation (Non-Medical): Not on file  Physical Activity:   . Days of Exercise per Week: Not on file  . Minutes of Exercise per Session: Not on file  Stress:   . Feeling of Stress : Not on file  Social Connections:   . Frequency of Communication with Friends and Family: Not on file  . Frequency of Social Gatherings with Friends and Family: Not on file  . Attends Religious Services: Not on file  . Active Member of Clubs or Organizations: Not on file  . Attends Archivist Meetings: Not on file  . Marital Status: Not on file  Intimate Partner Violence:   . Fear of Current or Ex-Partner: Not on file  . Emotionally Abused: Not on file  . Physically Abused: Not on file  . Sexually Abused: Not on file    No past surgical history on file.  No family history on file.  No Known Allergies  Current Outpatient Medications on File Prior to Visit  Medication Sig Dispense Refill  . ALPRAZolam (XANAX) 0.25 MG tablet Take 1 tablet (0.25 mg total) by mouth every 8 (eight) hours as needed for anxiety. (Patient not taking: Reported on 08/27/2018) 10 tablet 0  . metoprolol succinate (TOPROL-XL) 50 MG 24 hr tablet Take 0.5 tablet in the morning and 1 whole tablet in evening (Patient not taking: Reported on 08/27/2018) 45 tablet 6   No current facility-administered medications on file prior to visit.    There were no vitals taken for this visit.      Objective:   Physical Exam        Assessment & Plan:

## 2019-12-19 NOTE — Patient Instructions (Addendum)
For you wellness exam today I have ordered cbc, cmp, tsh, lipid panel, ua and hiv.  Vaccine given today.   For mild fatigue will add b1, b12 and vit d level.  For left shouder pain, will get xray. After review xray may refer to sport med to evaluate rotator cuff.  Recommend exercise and healthy diet.  We will let you know lab results as they come in.  Follow up date appointment will be determined after lab review.    Preventive Care 55-40 Years Old, Male Preventive care refers to lifestyle choices and visits with your health care provider that can promote health and wellness. This includes:  A yearly physical exam. This is also called an annual well check.  Regular dental and eye exams.  Immunizations.  Screening for certain conditions.  Healthy lifestyle choices, such as eating a healthy diet, getting regular exercise, not using drugs or products that contain nicotine and tobacco, and limiting alcohol use. What can I expect for my preventive care visit? Physical exam Your health care provider will check:  Height and weight. These may be used to calculate body mass index (BMI), which is a measurement that tells if you are at a healthy weight.  Heart rate and blood pressure.  Your skin for abnormal spots. Counseling Your health care provider may ask you questions about:  Alcohol, tobacco, and drug use.  Emotional well-being.  Home and relationship well-being.  Sexual activity.  Eating habits.  Work and work Statistician. What immunizations do I need?  Influenza (flu) vaccine  This is recommended every year. Tetanus, diphtheria, and pertussis (Tdap) vaccine  You may need a Td booster every 10 years. Varicella (chickenpox) vaccine  You may need this vaccine if you have not already been vaccinated. Zoster (shingles) vaccine  You may need this after age 62. Measles, mumps, and rubella (MMR) vaccine  You may need at least one dose of MMR if you were born in  1957 or later. You may also need a second dose. Pneumococcal conjugate (PCV13) vaccine  You may need this if you have certain conditions and were not previously vaccinated. Pneumococcal polysaccharide (PPSV23) vaccine  You may need one or two doses if you smoke cigarettes or if you have certain conditions. Meningococcal conjugate (MenACWY) vaccine  You may need this if you have certain conditions. Hepatitis A vaccine  You may need this if you have certain conditions or if you travel or work in places where you may be exposed to hepatitis A. Hepatitis B vaccine  You may need this if you have certain conditions or if you travel or work in places where you may be exposed to hepatitis B. Haemophilus influenzae type b (Hib) vaccine  You may need this if you have certain risk factors. Human papillomavirus (HPV) vaccine  If recommended by your health care provider, you may need three doses over 6 months. You may receive vaccines as individual doses or as more than one vaccine together in one shot (combination vaccines). Talk with your health care provider about the risks and benefits of combination vaccines. What tests do I need? Blood tests  Lipid and cholesterol levels. These may be checked every 5 years, or more frequently if you are over 33 years old.  Hepatitis C test.  Hepatitis B test. Screening  Lung cancer screening. You may have this screening every year starting at age 26 if you have a 30-pack-year history of smoking and currently smoke or have quit within the past 15  years.  Prostate cancer screening. Recommendations will vary depending on your family history and other risks.  Colorectal cancer screening. All adults should have this screening starting at age 89 and continuing until age 65. Your health care provider may recommend screening at age 58 if you are at increased risk. You will have tests every 1-10 years, depending on your results and the type of screening  test.  Diabetes screening. This is done by checking your blood sugar (glucose) after you have not eaten for a while (fasting). You may have this done every 1-3 years.  Sexually transmitted disease (STD) testing. Follow these instructions at home: Eating and drinking  Eat a diet that includes fresh fruits and vegetables, whole grains, lean protein, and low-fat dairy products.  Take vitamin and mineral supplements as recommended by your health care provider.  Do not drink alcohol if your health care provider tells you not to drink.  If you drink alcohol: ? Limit how much you have to 0-2 drinks a day. ? Be aware of how much alcohol is in your drink. In the [place] one drink equals one 12 oz bottle of beer (355 mL), one 5 oz glass of wine (148 mL), or one 1 oz glass of hard liquor (44 mL). Lifestyle  Take daily care of your teeth and gums.  Stay active. Exercise for at least 30 minutes on 5 or more days each week.  Do not use any products that contain nicotine or tobacco, such as cigarettes, e-cigarettes, and chewing tobacco. If you need help quitting, ask your health care provider.  If you are sexually active, practice safe sex. Use a condom or other form of protection to prevent STIs (sexually transmitted infections).  Talk with your health care provider about taking a low-dose aspirin every day starting at age 6. What's next?  Go to your health care provider once a year for a well check visit.  Ask your health care provider how often you should have your eyes and teeth checked.  Stay up to date on all vaccines. This information is not intended to replace advice given to you by your health care provider. Make sure you discuss any questions you have with your health care provider. Document Revised: 11/29/2018 Document Reviewed: 11/29/2018 Elsevier Patient Education  2020 [employer].

## 2019-12-19 NOTE — Telephone Encounter (Signed)
Referral to sports medicine placed

## 2019-12-23 ENCOUNTER — Other Ambulatory Visit (INDEPENDENT_AMBULATORY_CARE_PROVIDER_SITE_OTHER): Payer: BC Managed Care – PPO

## 2019-12-23 DIAGNOSIS — R739 Hyperglycemia, unspecified: Secondary | ICD-10-CM

## 2019-12-23 LAB — HEMOGLOBIN A1C: Hgb A1c MFr Bld: 5.1 % (ref 4.6–6.5)

## 2019-12-24 LAB — VITAMIN D 1,25 DIHYDROXY
Vitamin D 1, 25 (OH)2 Total: 42 pg/mL (ref 18–72)
Vitamin D2 1, 25 (OH)2: <8 pg/mL
Vitamin D3 1, 25 (OH)2: 42 pg/mL

## 2019-12-24 LAB — VITAMIN B1: Vitamin B1 (Thiamine): 8 nmol/L (ref 8–30)

## 2020-01-02 DIAGNOSIS — Z20828 Contact with and (suspected) exposure to other viral communicable diseases: Secondary | ICD-10-CM | POA: Diagnosis not present

## 2020-01-05 ENCOUNTER — Encounter: Payer: Self-pay | Admitting: Medical

## 2020-01-06 ENCOUNTER — Telehealth: Payer: Self-pay | Admitting: Medical

## 2020-01-06 NOTE — Telephone Encounter (Signed)
Scheduled patient

## 2020-01-06 NOTE — Telephone Encounter (Signed)
Will you call pt and help him get scheduled for tdap.

## 2020-01-09 ENCOUNTER — Ambulatory Visit (INDEPENDENT_AMBULATORY_CARE_PROVIDER_SITE_OTHER): Payer: BC Managed Care – PPO | Admitting: *Deleted

## 2020-01-09 ENCOUNTER — Ambulatory Visit: Payer: BC Managed Care – PPO | Admitting: Family Medicine

## 2020-01-09 ENCOUNTER — Ambulatory Visit: Payer: Self-pay

## 2020-01-09 ENCOUNTER — Encounter: Payer: Self-pay | Admitting: Family Medicine

## 2020-01-09 ENCOUNTER — Other Ambulatory Visit: Payer: Self-pay

## 2020-01-09 VITALS — BP 110/71 | HR 69 | Ht 69.0 in | Wt 163.0 lb

## 2020-01-09 DIAGNOSIS — Z23 Encounter for immunization: Secondary | ICD-10-CM

## 2020-01-09 DIAGNOSIS — M75102 Unspecified rotator cuff tear or rupture of left shoulder, not specified as traumatic: Secondary | ICD-10-CM

## 2020-01-09 DIAGNOSIS — G8929 Other chronic pain: Secondary | ICD-10-CM

## 2020-01-09 MED ORDER — NITROGLYCERIN 0.2 MG/HR TD PT24: MEDICATED_PATCH | TRANSDERMAL | 11 refills | Status: DC

## 2020-01-09 NOTE — Progress Notes (Signed)
Patient here for tdap vaccine.    Vaccine given and patient tolerated well.

## 2020-01-09 NOTE — Patient Instructions (Signed)
Nice to meet you Please try the nitro patches  Please try the exercises  Please try ice if needed  Please send me a message in MyChart with any questions or updates.  Please see me back in 6-8 weeks.   --Dr. Jordan Likes  Nitroglycerin Protocol   Apply 1/4 nitroglycerin patch to affected area daily.  Change position of patch within the affected area every 24 hours.  You may experience a headache during the first 1-2 weeks of using the patch, these should subside.  If you experience headaches after beginning nitroglycerin patch treatment, you may take your preferred over the counter pain reliever.  Another side effect of the nitroglycerin patch is skin irritation or rash related to patch adhesive.  Please notify our office if you develop more severe headaches or rash, and stop the patch.  Tendon healing with nitroglycerin patch may require 12 to 24 weeks depending on the extent of injury.  Men should not use if taking Viagra, Cialis, or Levitra.   Do not use if you have migraines or rosacea.

## 2020-01-09 NOTE — Progress Notes (Signed)
Walter Harvey - 39 y.o. male MRN [id number]  Date of birth: [birth year]  SUBJECTIVE:  Including CC & ROS.  Chief Complaint  Patient presents with  . Shoulder Pain    left shoulder    Walter Harvey is a 39 y.o. male that is presenting with acute on chronic left shoulder pain.  He felt the pain initially occurred after he was waterskiing and wakeboarding.  The jerk of the boat seem to cause potentially an injury.  He is an avid mountain biker and only notices the shoulder pain and abduction.  He does not notice it throughout most of the day.  Is only in random occurrences.  It can be sharp and stabbing.  Has not had any improvement since last July.  No history of similar symptoms.  No radicular symptoms..  Independent review of the left shoulder x-ray shows no acute abnormality.   Review of Systems See HPI   HISTORY: Past Medical, Surgical, Social, and Family History Reviewed & Updated per EMR.   Pertinent Historical Findings include:  Past Medical History:  Diagnosis Date  . Anxiety 04/05/2018  . SVT (supraventricular tachycardia) (HCC)     No past surgical history on file.  No Known Allergies  No family history on file.   Social History   Socioeconomic History  . Marital status: Married    Spouse name: Not on file  . Number of children: Not on file  . Years of education: Not on file  . Highest education level: Not on file  Occupational History  . Not on file  Tobacco Use  . Smoking status: Never Smoker  . Smokeless tobacco: Never Used  Substance and Sexual Activity  . Alcohol use: Yes  . Drug use: No  . Sexual activity: Not on file  Other Topics Concern  . Not on file  Social History Narrative  . Not on file   Social Determinants of Health   Financial Resource Strain:   . Difficulty of Paying Living Expenses: Not on file  Food Insecurity:   . Worried About Charity fundraiser in the Last Year: Not on file  . Ran Out of Food in the Last Year: Not on file    Transportation Needs:   . Lack of Transportation (Medical): Not on file  . Lack of Transportation (Non-Medical): Not on file  Physical Activity:   . Days of Exercise per Week: Not on file  . Minutes of Exercise per Session: Not on file  Stress:   . Feeling of Stress : Not on file  Social Connections:   . Frequency of Communication with Friends and Family: Not on file  . Frequency of Social Gatherings with Friends and Family: Not on file  . Attends Religious Services: Not on file  . Active Member of Clubs or Organizations: Not on file  . Attends Archivist Meetings: Not on file  . Marital Status: Not on file  Intimate Partner Violence:   . Fear of Current or Ex-Partner: Not on file  . Emotionally Abused: Not on file  . Physically Abused: Not on file  . Sexually Abused: Not on file     PHYSICAL EXAM:  VS: BP 110/71   Pulse 69   Ht 5\' 9"  (1.753 m)   Wt 163 lb (73.9 kg)   BMI 24.07 kg/m  Physical Exam Gen: NAD, alert, cooperative with exam, well-appearing ENT: normal lips, normal nasal mucosa,  Eye: normal EOM, normal conjunctiva and lids Skin: no rashes, no  areas of induration  Neuro: normal tone, normal sensation to touch Psych:  normal insight, alert and oriented MSK:  Left shoulder: Normal range of motion. Normal strength resistance. Mild pain with empty can testing. Negative speeds test. Negative O'Brien's test. Neurovascularly intact  Limited ultrasound: Left shoulder:  Normal-appearing biceps tendon in long and short axis. Normal subscapularis. There appears possible interstitial tears of the supraspinatus but these to be small with no significant retraction. Normal-appearing posterior glenohumeral joint. Normal-appearing AC joint  Summary: Findings suggestive of possible interstitial tears of the supraspinatus.  Ultrasound and interpretation by Clare Gandy, MD    ASSESSMENT & PLAN:   Tear of left supraspinatus tendon There seems to be  small interstitial tears of the supraspinatus with no significant large tear.  May be a bit of impingement but no significant change on dynamic testing. -Nitro patches. -Counseled on home exercise therapy and supportive care. -If no improvement can consider physical therapy or injection.

## 2020-01-09 NOTE — Assessment & Plan Note (Signed)
There seems to be small interstitial tears of the supraspinatus with no significant large tear.  May be a bit of impingement but no significant change on dynamic testing. -Nitro patches. -Counseled on home exercise therapy and supportive care. -If no improvement can consider physical therapy or injection.

## 2020-03-05 ENCOUNTER — Ambulatory Visit: Payer: BC Managed Care – PPO | Admitting: Family Medicine

## 2020-04-18 DIAGNOSIS — Z20828 Contact with and (suspected) exposure to other viral communicable diseases: Secondary | ICD-10-CM | POA: Diagnosis not present

## 2020-06-08 DIAGNOSIS — Z20822 Contact with and (suspected) exposure to covid-19: Secondary | ICD-10-CM | POA: Diagnosis not present

## 2020-09-26 DIAGNOSIS — Z20822 Contact with and (suspected) exposure to covid-19: Secondary | ICD-10-CM | POA: Diagnosis not present

## 2021-01-14 DIAGNOSIS — Z20822 Contact with and (suspected) exposure to covid-19: Secondary | ICD-10-CM | POA: Diagnosis not present

## 2021-04-08 ENCOUNTER — Other Ambulatory Visit: Payer: Self-pay

## 2021-04-09 ENCOUNTER — Ambulatory Visit (INDEPENDENT_AMBULATORY_CARE_PROVIDER_SITE_OTHER): Payer: BC Managed Care – PPO | Admitting: Medical

## 2021-04-09 VITALS — BP 108/66 | HR 65 | Resp 18 | Ht 69.0 in | Wt 150.0 lb

## 2021-04-09 DIAGNOSIS — R5383 Other fatigue: Secondary | ICD-10-CM

## 2021-04-09 DIAGNOSIS — Z833 Family history of diabetes mellitus: Secondary | ICD-10-CM | POA: Diagnosis not present

## 2021-04-09 DIAGNOSIS — Z113 Encounter for screening for infections with a predominantly sexual mode of transmission: Secondary | ICD-10-CM

## 2021-04-09 DIAGNOSIS — Z0000 Encounter for general adult medical examination without abnormal findings: Secondary | ICD-10-CM | POA: Diagnosis not present

## 2021-04-09 LAB — CBC WITH DIFFERENTIAL/PLATELET
Basophils Absolute: 0 10*3/uL (ref 0.0–0.1)
Basophils Absolute: 0 10*3/uL (ref 0.0–0.1)
Basophils Relative: 0.3 % (ref 0.0–3.0)
Basophils Relative: 0.3 % (ref 0.0–3.0)
Eosinophils Absolute: 0.1 10*3/uL (ref 0.0–0.7)
Eosinophils Absolute: 0.1 10*3/uL (ref 0.0–0.7)
Eosinophils Relative: 1.5 % (ref 0.0–5.0)
Eosinophils Relative: 1.4 % (ref 0.0–5.0)
HCT: 44.1 % (ref 39.0–52.0)
HCT: 44.6 % (ref 39.0–52.0)
Hemoglobin: 15.4 g/dL (ref 13.0–17.0)
Hemoglobin: 15.4 g/dL (ref 13.0–17.0)
Lymphocytes Relative: 35.5 % (ref 12.0–46.0)
Lymphocytes Relative: 36.4 % (ref 12.0–46.0)
Lymphs Abs: 2 10*3/uL (ref 0.7–4.0)
Lymphs Abs: 1.9 10*3/uL (ref 0.7–4.0)
MCHC: 34.8 g/dL (ref 30.0–36.0)
MCHC: 34.6 g/dL (ref 30.0–36.0)
MCV: 95.1 fl (ref 78.0–100.0)
MCV: 95.4 fl (ref 78.0–100.0)
Monocytes Absolute: 0.4 10*3/uL (ref 0.1–1.0)
Monocytes Absolute: 0.4 10*3/uL (ref 0.1–1.0)
Monocytes Relative: 7.6 % (ref 3.0–12.0)
Monocytes Relative: 7.4 % (ref 3.0–12.0)
Neutro Abs: 3.1 10*3/uL (ref 1.4–7.7)
Neutro Abs: 2.9 10*3/uL (ref 1.4–7.7)
Neutrophils Relative %: 55.1 % (ref 43.0–77.0)
Neutrophils Relative %: 54.5 % (ref 43.0–77.0)
Platelets: 267 10*3/uL (ref 150.0–400.0)
Platelets: 260 10*3/uL (ref 150.0–400.0)
RBC: 4.64 Mil/uL (ref 4.22–5.81)
RBC: 4.68 Mil/uL (ref 4.22–5.81)
RDW: 13 % (ref 11.5–15.5)
RDW: 13.1 % (ref 11.5–15.5)
WBC: 5.6 10*3/uL (ref 4.0–10.5)
WBC: 5.4 10*3/uL (ref 4.0–10.5)

## 2021-04-09 LAB — COMPREHENSIVE METABOLIC PANEL
ALT: 21 U/L (ref 0–53)
AST: 17 U/L (ref 0–37)
Albumin: 4.8 g/dL (ref 3.5–5.2)
Alkaline Phosphatase: 54 U/L (ref 39–117)
BUN: 17 mg/dL (ref 6–23)
CO2: 29 mEq/L (ref 19–32)
Calcium: 9.6 mg/dL (ref 8.4–10.5)
Chloride: 101 mEq/L (ref 96–112)
Creatinine, Ser: 0.87 mg/dL (ref 0.40–1.50)
GFR: 107.13 mL/min (ref >60.00)
Glucose, Bld: 83 mg/dL (ref 70–99)
Potassium: 5 mEq/L (ref 3.5–5.1)
Sodium: 138 mEq/L (ref 135–145)
Total Bilirubin: 1.1 mg/dL (ref 0.2–1.2)
Total Protein: 7.6 g/dL (ref 6.0–8.3)

## 2021-04-09 LAB — LIPID PANEL
Cholesterol: 147 mg/dL (ref 0–200)
HDL: 53.1 mg/dL (ref >39.00)
LDL Cholesterol: 83 mg/dL (ref 0–99)
NonHDL: 93.82
Total CHOL/HDL Ratio: 3
Triglycerides: 52 mg/dL (ref 0.0–149.0)
VLDL: 10.4 mg/dL (ref 0.0–40.0)

## 2021-04-09 LAB — TSH: TSH: 0.87 u[IU]/mL (ref 0.35–4.50)

## 2021-04-09 LAB — HEMOGLOBIN A1C: Hgb A1c MFr Bld: 5 % (ref 4.6–6.5)

## 2021-04-09 LAB — VITAMIN B12: Vitamin B-12: 409 pg/mL (ref 211–911)

## 2021-04-09 LAB — T4, FREE: Free T4: 0.82 ng/dL (ref 0.60–1.60)

## 2021-04-09 NOTE — Patient Instructions (Addendum)
For you wellness exam today I have ordered cbc, cmp, lipid panel and hiv.  For fatigue will get tsh, t4, b12, b1 and vit D.  Vaccine up to date.   Recommend exercise and healthy diet.  We will let you know lab results as they come in.  Follow up date appointment will be determined after lab review.    Preventive Care 42-42 Years Old, Male Preventive care refers to lifestyle choices and visits with your health care provider that can promote health and wellness. This includes:  A yearly physical exam. This is also called an annual wellness visit.  Regular dental and eye exams.  Immunizations.  Screening for certain conditions.  Healthy lifestyle choices, such as: ? Eating a healthy diet. ? Getting regular exercise. ? Not using drugs or products that contain nicotine and tobacco. ? Limiting alcohol use. What can I expect for my preventive care visit? Physical exam Your health care provider will check your:  Height and weight. These may be used to calculate your BMI (body mass index). BMI is a measurement that tells if you are at a healthy weight.  Heart rate and blood pressure.  Body temperature.  Skin for abnormal spots. Counseling Your health care provider may ask you questions about your:  Past medical problems.  Family's medical history.  Alcohol, tobacco, and drug use.  Emotional well-being.  Home life and relationship well-being.  Sexual activity.  Diet, exercise, and sleep habits.  Work and work Astronomer.  Access to firearms. What immunizations do I need? Vaccines are usually given at various ages, according to a schedule. Your health care provider will recommend vaccines for you based on your age, medical history, and lifestyle or other factors, such as travel or where you work.   What tests do I need? Blood tests  Lipid and cholesterol levels. These may be checked every 5 years, or more often if you are over 6 years old.  Hepatitis C  test.  Hepatitis B test. Screening  Lung cancer screening. You may have this screening every year starting at age 75 if you have a 30-pack-year history of smoking and currently smoke or have quit within the past 15 years.  Prostate cancer screening. Recommendations will vary depending on your family history and other risks.  Genital exam to check for testicular cancer or hernias.  Colorectal cancer screening. ? All adults should have this screening starting at age 60 and continuing until age 61. ? Your health care provider may recommend screening at age 58 if you are at increased risk. ? You will have tests every 1-10 years, depending on your results and the type of screening test.  Diabetes screening. ? This is done by checking your blood sugar (glucose) after you have not eaten for a while (fasting). ? You may have this done every 1-3 years.  STD (sexually transmitted disease) testing, if you are at risk. Follow these instructions at home: Eating and drinking  Eat a diet that includes fresh fruits and vegetables, whole grains, lean protein, and low-fat dairy products.  Take vitamin and mineral supplements as recommended by your health care provider.  Do not drink alcohol if your health care provider tells you not to drink.  If you drink alcohol: ? Limit how much you have to 0-2 drinks a day. ? Be aware of how much alcohol is in your drink. In the [place] one drink equals one 12 oz bottle of beer (355 mL), one 5 oz glass of wine (  148 mL), or one 1 oz glass of hard liquor (44 mL).   Lifestyle  Take daily care of your teeth and gums. Brush your teeth every morning and night with fluoride toothpaste. Floss one time each day.  Stay active. Exercise for at least 30 minutes 5 or more days each week.  Do not use any products that contain nicotine or tobacco, such as cigarettes, e-cigarettes, and chewing tobacco. If you need help quitting, ask your health care provider.  Do not use  drugs.  If you are sexually active, practice safe sex. Use a condom or other form of protection to prevent STIs (sexually transmitted infections).  If told by your health care provider, take low-dose aspirin daily starting at age 27.  Find healthy ways to cope with stress, such as: ? Meditation, yoga, or listening to music. ? Journaling. ? Talking to a trusted person. ? Spending time with friends and family. Safety  Always wear your seat belt while driving or riding in a vehicle.  Do not drive: ? If you have been drinking alcohol. Do not ride with someone who has been drinking. ? When you are tired or distracted. ? While texting.  Wear a helmet and other protective equipment during sports activities.  If you have firearms in your house, make sure you follow all gun safety procedures. What's next?  Go to your health care provider once a year for an annual wellness visit.  Ask your health care provider how often you should have your eyes and teeth checked.  Stay up to date on all vaccines. This information is not intended to replace advice given to you by your health care provider. Make sure you discuss any questions you have with your health care provider. Document Revised: 09/03/2019 Document Reviewed: 11/29/2018 Elsevier Patient Education  2021 [employer].

## 2021-04-09 NOTE — Progress Notes (Signed)
Subjective:    Patient ID: Walter Harvey, male    DOB: 1981/01/10, 40 y.o.   MRN: [id number]  HPI  Pt has cpe today.   Pt works at [employer], married has 3 children. Recently during the winter he did not exercise much. Plans to restart.. Married and has 3 children.  On review pt history he is no longer having any tachcyardia. Pt decided not to follow up with Dr. Richard Miu. Not on b blocker.   Pt has had covid vaccines. Had 3 vaccines.  Review of Systems  Constitutional: Positive for fatigue. Negative for chills and fever.  HENT: Negative for congestion and ear discharge.   Respiratory: Negative for cough, chest tightness, shortness of breath and wheezing.   Cardiovascular: Negative for chest pain and palpitations.  Gastrointestinal: Negative for abdominal pain.  Genitourinary: Negative for dysuria, flank pain and hematuria.  Musculoskeletal: Negative for back pain and joint swelling.  Skin: Negative for rash.  Neurological: Negative for dizziness, seizures, syncope, weakness, numbness and headaches.  Hematological: Negative for adenopathy. Does not bruise/bleed easily.  Psychiatric/Behavioral: Negative for behavioral problems and confusion.     Past Medical History:  Diagnosis Date  . Anxiety 04/05/2018  . SVT (supraventricular tachycardia) (HCC)      Social History   Socioeconomic History  . Marital status: Married    Spouse name: Not on file  . Number of children: Not on file  . Years of education: Not on file  . Highest education level: Not on file  Occupational History  . Not on file  Tobacco Use  . Smoking status: Never Smoker  . Smokeless tobacco: Never Used  Vaping Use  . Vaping Use: Never used  Substance and Sexual Activity  . Alcohol use: Yes  . Drug use: No  . Sexual activity: Not on file  Other Topics Concern  . Not on file  Social History Narrative  . Not on file   Social Determinants of Health   Financial Resource Strain: Not on file  Food  Insecurity: Not on file  Transportation Needs: Not on file  Physical Activity: Not on file  Stress: Not on file  Social Connections: Not on file  Intimate Partner Violence: Not on file    No past surgical history on file.  No family history on file.  No Known Allergies  No current outpatient medications on file prior to visit.   No current facility-administered medications on file prior to visit.    BP 108/66   Pulse 65   Resp 18   Ht 5\' 9"  (1.753 m)   Wt 150 lb (68 kg)   SpO2 100%   BMI 22.15 kg/m       Objective:   Physical Exam  General Mental Status- Alert. General Appearance- Not in acute distress.   Skin General: Color- Normal Color. Moisture- Normal Moisture.  Neck Carotid Arteries- Normal color. Moisture- Normal Moisture. No carotid bruits. No JVD.  Chest and Lung Exam Auscultation: Breath Sounds:-Normal.  Cardiovascular Auscultation:Rythm- Regular. Murmurs & Other Heart Sounds:Auscultation of the heart reveals- No Murmurs.  Abdomen Inspection:-Inspeection Normal. Palpation/Percussion:Note:No mass. Palpation and Percussion of the abdomen reveal- Non Tender, Non Distended + BS, no rebound or guarding.    Neurologic Cranial Nerve exam:- CN III-XII intact(No nystagmus), symmetric smile. Strength:- 5/5 equal and symmetric strength both upper and lower extremities.      Assessment & Plan:  For you wellness exam today I have ordered cbc, cmp, lipid panel and hiv.  For fatigue  will get tsh, t4, b12, b1 and vit D.  Vaccine up to date.   Recommend exercise and healthy diet.  We will let you know lab results as they come in.  Follow up date appointment will be determined after lab review.   Esperanza Richters, PA-C

## 2021-04-13 LAB — HIV ANTIBODY (ROUTINE TESTING W REFLEX): HIV 1&2 Ab, 4th Generation: NONREACTIVE

## 2021-04-13 LAB — VITAMIN B1: Vitamin B1 (Thiamine): 8 nmol/L (ref 8–30)

## 2021-07-11 DIAGNOSIS — Z20822 Contact with and (suspected) exposure to covid-19: Secondary | ICD-10-CM | POA: Diagnosis not present

## 2021-08-30 ENCOUNTER — Other Ambulatory Visit: Payer: Self-pay

## 2021-08-30 ENCOUNTER — Ambulatory Visit: Payer: BC Managed Care – PPO | Admitting: Medical

## 2021-08-30 VITALS — BP 121/66 | HR 69 | Resp 18 | Ht 69.0 in | Wt 158.2 lb

## 2021-08-30 DIAGNOSIS — R5383 Other fatigue: Secondary | ICD-10-CM | POA: Diagnosis not present

## 2021-08-30 DIAGNOSIS — R251 Tremor, unspecified: Secondary | ICD-10-CM

## 2021-08-30 DIAGNOSIS — R739 Hyperglycemia, unspecified: Secondary | ICD-10-CM

## 2021-08-30 DIAGNOSIS — Z87898 Personal history of other specified conditions: Secondary | ICD-10-CM | POA: Diagnosis not present

## 2021-08-30 DIAGNOSIS — R0789 Other chest pain: Secondary | ICD-10-CM

## 2021-08-30 DIAGNOSIS — E559 Vitamin D deficiency, unspecified: Secondary | ICD-10-CM

## 2021-08-30 LAB — COMPREHENSIVE METABOLIC PANEL
ALT: 22 U/L (ref 0–53)
AST: 14 U/L (ref 0–37)
Albumin: 4.8 g/dL (ref 3.5–5.2)
Alkaline Phosphatase: 49 U/L (ref 39–117)
BUN: 13 mg/dL (ref 6–23)
CO2: 29 mEq/L (ref 19–32)
Calcium: 9.7 mg/dL (ref 8.4–10.5)
Chloride: 101 mEq/L (ref 96–112)
Creatinine, Ser: 0.85 mg/dL (ref 0.40–1.50)
GFR: 107.59 mL/min (ref >60.00)
Glucose, Bld: 97 mg/dL (ref 70–99)
Potassium: 4.5 mEq/L (ref 3.5–5.1)
Sodium: 138 mEq/L (ref 135–145)
Total Bilirubin: 1.1 mg/dL (ref 0.2–1.2)
Total Protein: 7.3 g/dL (ref 6.0–8.3)

## 2021-08-30 LAB — CBC WITH DIFFERENTIAL/PLATELET
Basophils Absolute: 0 10*3/uL (ref 0.0–0.1)
Basophils Relative: 0.3 % (ref 0.0–3.0)
Eosinophils Absolute: 0.1 10*3/uL (ref 0.0–0.7)
Eosinophils Relative: 1.5 % (ref 0.0–5.0)
HCT: 42.3 % (ref 39.0–52.0)
Hemoglobin: 14.5 g/dL (ref 13.0–17.0)
Lymphocytes Relative: 39.1 % (ref 12.0–46.0)
Lymphs Abs: 1.7 10*3/uL (ref 0.7–4.0)
MCHC: 34.2 g/dL (ref 30.0–36.0)
MCV: 96.5 fl (ref 78.0–100.0)
Monocytes Absolute: 0.5 10*3/uL (ref 0.1–1.0)
Monocytes Relative: 10.2 % (ref 3.0–12.0)
Neutro Abs: 2.2 10*3/uL (ref 1.4–7.7)
Neutrophils Relative %: 48.9 % (ref 43.0–77.0)
Platelets: 253 10*3/uL (ref 150.0–400.0)
RBC: 4.39 Mil/uL (ref 4.22–5.81)
RDW: 13.6 % (ref 11.5–15.5)
WBC: 4.4 10*3/uL (ref 4.0–10.5)

## 2021-08-30 LAB — T4, FREE: Free T4: 0.78 ng/dL (ref 0.60–1.60)

## 2021-08-30 LAB — VITAMIN B12: Vitamin B-12: 457 pg/mL (ref 211–911)

## 2021-08-30 LAB — TSH: TSH: 0.84 u[IU]/mL (ref 0.35–5.50)

## 2021-08-30 LAB — VITAMIN D 25 HYDROXY (VIT D DEFICIENCY, FRACTURES): VITD: 16.82 ng/mL — ABNORMAL LOW (ref 30.00–100.00)

## 2021-08-30 LAB — HEMOGLOBIN A1C: Hgb A1c MFr Bld: 5.1 % (ref 4.6–6.5)

## 2021-08-30 MED ORDER — VITAMIN D (ERGOCALCIFEROL) 1.25 MG (50000 UNIT) PO CAPS: 50000.0000 [IU] | ORAL_CAPSULE | ORAL | 0 refills | Status: DC

## 2021-08-30 NOTE — Addendum Note (Signed)
Addended by: Mervin Kung A on: 08/30/2021 11:04 AM   Modules accepted: Orders

## 2021-08-30 NOTE — Patient Instructions (Addendum)
For fatigue we got cbc, cmp, tsh, t4 and vit d level.  You have some shakiness at times but normal neurologic exam presently. Normal grip strength. If your labs area normal and you report worse shaking consider neurology referral.   For hx of palpitation and very atypical chest pain we got ekg today. Showed normal sinus rhythm(no changes compared to prior 2019 ekg).  You mentioned that you will be going up to see cardiologist office as you desire follow up since have not seen for 2 years. Will place referral.  Your left upper chest area today is low level and reproducible. I would recommend you try low dose ibuprofen and low dose tylenol to see if that pain resolved. Since pain is present on and off could refer to sports medicine and pain could be in pectoralis and possble in connecting tendon.  Hx of mild elevated sugar in the past. Will get a1c.  Follow up in 3-4 weeks or sooner if needed.

## 2021-08-30 NOTE — Addendum Note (Signed)
Addended by: Gwenevere Abbot on: 08/30/2021 09:19 PM   Modules accepted: Orders

## 2021-08-30 NOTE — Progress Notes (Signed)
Subjective:    Patient ID: Walter Harvey, male    DOB: 1981/08/03, 40 y.o.   MRN: [id number]  HPI  Pt in for feeling of fatigue overall.  Pt states feels tired or stressed will feel shaky.  Pt states he follow advise of functional medicine. He does some fasting at times 15-16 hours(nite time mostly then eats late breakfast). He does this 3-4 times a week. Pt does not correlated occasional shaking of hands to end of fastin period.  Pt does have history of traveling a lot his job. Sometimes has insomnia while traveling. Did not change times zones. The insomnia is not constant.   Pt has hx of palpitations. He has hx of psvt. Last cardiologist note A/P.  " Supraventricular tachycardia with atrial tachycardia ablation doing well from that point review we will continue present management. PVCs I advised him to start taking back beta-blocker if palpitations become bothersome. Anxiety he change some of his schedule he sleeps a little bit longer he started going to gym on the regular basis and he is enjoying it however he does not want to take Xanax anymore.  Overall he is doing well Atypical chest pain.  Very atypical not related to exercise."   History of very mild left upper chest pain. States pain is occurring twice a week for more than one year.Pt states does not feel cardiac. Pt states work up negative in past.    Review of Systems  Constitutional:  Positive for fatigue. Negative for chills and fever.  HENT:  Negative for congestion.   Respiratory:  Negative for cough, choking, shortness of breath and wheezing.   Cardiovascular:  Positive for chest pain. Negative for palpitations.       Aypical left upper chest area pain.   Gastrointestinal:  Negative for abdominal pain, blood in stool and constipation.  Musculoskeletal:  Negative for back pain, myalgias and neck pain.  Skin:  Negative for rash.  Neurological:  Negative for dizziness, syncope, weakness and headaches.       On eye  pain or vision changes  Hematological:  Negative for adenopathy. Does not bruise/bleed easily.  Psychiatric/Behavioral:  Negative for behavioral problems and confusion.     Past Medical History:  Diagnosis Date   Anxiety 04/05/2018   SVT (supraventricular tachycardia) (HCC)      Social History   Socioeconomic History   Marital status: Married    Spouse name: Not on file   Number of children: Not on file   Years of education: Not on file   Highest education level: Not on file  Occupational History   Not on file  Tobacco Use   Smoking status: Never   Smokeless tobacco: Never  Vaping Use   Vaping Use: Never used  Substance and Sexual Activity   Alcohol use: Yes   Drug use: No   Sexual activity: Not on file  Other Topics Concern   Not on file  Social History Narrative   Not on file   Social Determinants of Health   Financial Resource Strain: Not on file  Food Insecurity: Not on file  Transportation Needs: Not on file  Physical Activity: Not on file  Stress: Not on file  Social Connections: Not on file  Intimate Partner Violence: Not on file    No past surgical history on file.  No family history on file.  No Known Allergies  No current outpatient medications on file prior to visit.   No current facility-administered medications on file  prior to visit.    BP 121/66   Pulse 69   Resp 18   Ht 5\' 9"  (1.753 m)   Wt 158 lb 3.2 oz (71.8 kg)   SpO2 100%   BMI 23.36 kg/m       Objective:   Physical Exam  General Mental Status- Alert. General Appearance- Not in acute distress.   Skin General: Color- Normal Color. Moisture- Normal Moisture.  Neck Carotid Arteries- Normal color. Moisture- Normal Moisture. No carotid bruits. No JVD.  Chest and Lung Exam Auscultation: Breath Sounds:-Normal.  Cardiovascular Auscultation:Rythm- Regular. Murmurs & Other Heart Sounds:Auscultation of the heart reveals- No Murmurs.  Abdomen Inspection:-Inspeection  Normal. Palpation/Percussion:Note:No mass. Palpation and Percussion of the abdomen reveal- Non Tender, Non Distended + BS, no rebound or guarding.    Neurologic Cranial Nerve exam:- CN III-XII intact(No nystagmus), symmetric smile. Drift Test:- No drift. Romberg Exam:- Negative.  Heal to Toe Gait exam:-Normal. Finger to Nose:- Normal/Intact Strength:- 5/5 equal and symmetric strength both upper and lower extremities.   Anterior chest- left upper chest/pectoralis area mild direct tenderness to palpation.    Assessment & Plan:   Patient Instructions  For fatigue we got cbc, cmp, tsh, t4 and vit d level.  You have some shakiness at times but normal neurologic exam presently. Normal grip strength. If your labs area normal and you report worse shaking consider neurology referral.   For hx of palpitation and very atypical chest pain we got ekg today. Showed normal sinus rhythm.  You mentioned that you will be going up to see cardiologist office as you desire follow up since have not seen for 2 years. Will place referral.  Your left upper chest area today is low level and reproducible. I would recommend you try low dose ibuprofen and low dose tylenol to see if that pain resolved. Since pain is present on and off could refer to sports medicine and pain could be in pectoralis and possble in connecting tendon.  Hx of mild elevated sugar in the past. Will get a1c.  Follow up in 3-4 weeks or sooner if needed.   Time spent with patient today was  42 minutes which consisted of chart revdew, discussing diagnosis, work up, treatment, answering questions and documentation.

## 2021-09-02 LAB — VITAMIN B1: Vitamin B1 (Thiamine): 9 nmol/L (ref 8–30)

## 2021-10-15 ENCOUNTER — Ambulatory Visit: Payer: BC Managed Care – PPO | Admitting: Cardiology

## 2021-10-20 ENCOUNTER — Encounter: Payer: Self-pay | Admitting: Cardiology

## 2021-10-20 ENCOUNTER — Other Ambulatory Visit: Payer: Self-pay

## 2021-10-20 ENCOUNTER — Ambulatory Visit: Payer: BC Managed Care – PPO | Admitting: Cardiology

## 2021-10-20 VITALS — BP 113/79 | HR 69 | Ht 69.0 in | Wt 151.0 lb

## 2021-10-20 DIAGNOSIS — R002 Palpitations: Secondary | ICD-10-CM

## 2021-10-20 DIAGNOSIS — I471 Supraventricular tachycardia: Secondary | ICD-10-CM | POA: Diagnosis not present

## 2021-10-20 DIAGNOSIS — F419 Anxiety disorder, unspecified: Secondary | ICD-10-CM

## 2021-10-20 DIAGNOSIS — I493 Ventricular premature depolarization: Secondary | ICD-10-CM

## 2021-10-20 DIAGNOSIS — R0789 Other chest pain: Secondary | ICD-10-CM | POA: Diagnosis not present

## 2021-10-20 NOTE — Patient Instructions (Signed)

## 2021-10-20 NOTE — Progress Notes (Signed)
Cardiology Consultation:    Date:  10/20/2021   ID:  Walter Harvey, DOB [birth year], MRN [id number]  PCP:  Esperanza Richters, PA-C  Cardiologist:  Gypsy Balsam, MD   Referring MD: Esperanza Richters, [place]   Chief Complaint  Patient presents with   sharp chest pain     Ongoing for 6 months      History of Present Illness:    Walter Harvey is a 40 y.o. male who is being seen today for the evaluation of atypical chest pain at the request of [place], Ramon Dredge, [place].  I did see him couple years ago at the time active problem was atypical atrial tachycardia.  Eventually that problem has been fixed by him going to [facility] to did ablation since that time he is doing perfectly well.  Denies have any palpitation.  What prompted this visit today however a couple months ago he went to [place] and he spent few days over there riding his mountain bike and had no difficulty doing it.  After that he went to [place] it was 6000 feet elevation he was riding bike he was somewhat short of breath and tired however at evening time when he laying down on the bed he started having pinpoint sensation above left nipple.  She said that sensation lasted for about 3 hours.  There was no aggravating no relieving factor.  Interesting the next day he was able to go back on her ride bike this time electric bike and had no difficulty doing it.  Since that time he described another episodes of sensation I did happen at rest.  He never have any chest pain tightness squeezing pressure burning chest or sensation in the chest while exercising.  He exercised on the regular basis.  He rides stationary bike. Overall otherwise he is doing well.  He exercise on the regular basis he is trying to stick with good diet He described a stressful job   Past Medical History:  Diagnosis Date   Anxiety 04/05/2018   SVT (supraventricular tachycardia) (HCC)     History reviewed. No pertinent surgical history.  Current  Medications: Current Meds  Medication Sig   Vitamin D, Ergocalciferol, (DRISDOL) 1.25 MG (50000 UNIT) CAPS capsule Take 5,000 Units by mouth daily.   [DISCONTINUED] Vitamin D, Ergocalciferol, (DRISDOL) 1.25 MG (50000 UNIT) CAPS capsule Take 1 capsule (50,000 Units total) by mouth every 7 (seven) days.     Allergies:   Patient has no known allergies.   Social History   Socioeconomic History   Marital status: Married    Spouse name: Not on file   Number of children: Not on file   Years of education: Not on file   Highest education level: Not on file  Occupational History   Not on file  Tobacco Use   Smoking status: Never   Smokeless tobacco: Never  Vaping Use   Vaping Use: Never used  Substance and Sexual Activity   Alcohol use: Yes   Drug use: No   Sexual activity: Not on file  Other Topics Concern   Not on file  Social History Narrative   Not on file   Social Determinants of Health   Financial Resource Strain: Not on file  Food Insecurity: Not on file  Transportation Needs: Not on file  Physical Activity: Not on file  Stress: Not on file  Social Connections: Not on file     Family History: The patient's family history is not on file. ROS:  Please see the history of present illness.    All 14 point review of systems negative except as described per history of present illness.  EKGs/Labs/Other Studies Reviewed:    The following studies were reviewed today:   EKG:  EKG is  ordered today.  The ekg ordered today demonstrates normal sinus rhythm, right atrial enlargement, right axis deviation, potential pulmonary disease pattern.  However that EKG is identical to the EKG that he had done in 2019 in my office he did have echocardiogram done that time which was normal  Recent Labs: 08/30/2021: ALT 22; BUN 13; Creatinine, Ser 0.85; Hemoglobin 14.5; Platelets 253.0; Potassium 4.5; Sodium 138; TSH 0.84  Recent Lipid Panel    Component Value Date/Time   CHOL 147  04/09/2021 1151   TRIG 52.0 04/09/2021 1151   HDL 53.10 04/09/2021 1151   CHOLHDL 3 04/09/2021 1151   VLDL 10.4 04/09/2021 1151   LDLCALC 83 04/09/2021 1151    Physical Exam:    VS:  BP 113/79 (BP Location: Right Arm, Patient Position: Sitting)   Pulse 69   Ht 5\' 9"  (1.753 m)   Wt 151 lb (68.5 kg)   SpO2 97%   BMI 22.30 kg/m     Wt Readings from Last 3 Encounters:  10/20/21 151 lb (68.5 kg)  08/30/21 158 lb 3.2 oz (71.8 kg)  04/09/21 150 lb (68 kg)     GEN:  Well nourished, well developed in no acute distress HEENT: Normal NECK: No JVD; No carotid bruits LYMPHATICS: No lymphadenopathy CARDIAC: RRR, no murmurs, no rubs, no gallops RESPIRATORY:  Clear to auscultation without rales, wheezing or rhonchi  ABDOMEN: Soft, non-tender, non-distended MUSCULOSKELETAL:  No edema; No deformity  SKIN: Warm and dry NEUROLOGIC:  Alert and oriented x 3 PSYCHIATRIC:  Normal affect   ASSESSMENT:    1. Palpitations   2. PVC's (premature ventricular contractions)   3. PSVT (paroxysmal supraventricular tachycardia) (HCC)   4. Atrial tachycardia (HCC)   5. Atypical chest pain   6. Anxiety    PLAN:    In order of problems listed above:  Atypical chest pain.  Not related to exercise pinpoint location lasting for hours straight.  No new EKG changes.  I have very low level suspicion that this is ischemic pain.  At the same time he is able to exercise quite aggressively have no difficulty doing it.  We did have a long discussion about what to do with the situation and he says he is somewhat concerned about it I initiated conversation about doing potentially coronary CT angio to see if he got any obstructive disease however he is afraid of contrast and he does not want to do it right now.  I do think we have enough indication for antiplatelet therapy I doubt very much that the pain is related to his heart.  I suspect anxiety plays some role here. History of SVT, atrial tachycardia that being  successfully addressed with ablation that was done at [facility] clinic and he is doing very well since that time. Dyslipidemia I did look at his cholesterol which looks actually pretty good.  We will continue present management LDL 83 and HDL is 53.  Does not require therapy. Ask him to let me know if pain change characteristic of become more troubling.  I told him that if he wishes we can proceed with coronary CT angio.  He said he will think it over   Medication Adjustments/Labs and Tests Ordered: Current medicines are reviewed  at length with the patient today.  Concerns regarding medicines are outlined above.  Orders Placed This Encounter  Procedures   EKG 12-Lead   No orders of the defined types were placed in this encounter.   Signed, Georgeanna Lea, MD, [facility]. 10/20/2021 11:06 AM    [facility]

## 2022-05-11 ENCOUNTER — Ambulatory Visit: Payer: BC Managed Care – PPO | Admitting: Cardiology

## 2022-11-01 ENCOUNTER — Ambulatory Visit: Payer: BC Managed Care – PPO | Admitting: Medical

## 2023-04-03 ENCOUNTER — Encounter: Payer: Self-pay | Admitting: *Deleted

## 2023-10-20 ENCOUNTER — Ambulatory Visit (INDEPENDENT_AMBULATORY_CARE_PROVIDER_SITE_OTHER): Payer: BC Managed Care – PPO | Admitting: Medical

## 2023-10-20 ENCOUNTER — Other Ambulatory Visit (HOSPITAL_BASED_OUTPATIENT_CLINIC_OR_DEPARTMENT_OTHER): Payer: Self-pay

## 2023-10-20 VITALS — BP 108/62 | HR 76 | Temp 98.0°F | Resp 18 | Ht 69.0 in | Wt 156.0 lb

## 2023-10-20 DIAGNOSIS — Z0000 Encounter for general adult medical examination without abnormal findings: Secondary | ICD-10-CM

## 2023-10-20 DIAGNOSIS — R7989 Other specified abnormal findings of blood chemistry: Secondary | ICD-10-CM

## 2023-10-20 DIAGNOSIS — Z125 Encounter for screening for malignant neoplasm of prostate: Secondary | ICD-10-CM

## 2023-10-20 DIAGNOSIS — E559 Vitamin D deficiency, unspecified: Secondary | ICD-10-CM | POA: Diagnosis not present

## 2023-10-20 DIAGNOSIS — R5383 Other fatigue: Secondary | ICD-10-CM | POA: Diagnosis not present

## 2023-10-20 LAB — COMPREHENSIVE METABOLIC PANEL
ALT: 21 U/L (ref 0–53)
AST: 17 U/L (ref 0–37)
Albumin: 4.7 g/dL (ref 3.5–5.2)
Alkaline Phosphatase: 53 U/L (ref 39–117)
BUN: 13 mg/dL (ref 6–23)
CO2: 29 meq/L (ref 19–32)
Calcium: 9.3 mg/dL (ref 8.4–10.5)
Chloride: 101 meq/L (ref 96–112)
Creatinine, Ser: 0.89 mg/dL (ref 0.40–1.50)
GFR: 104.52 mL/min (ref >60.00)
Glucose, Bld: 87 mg/dL (ref 70–99)
Potassium: 4.3 meq/L (ref 3.5–5.1)
Sodium: 139 meq/L (ref 135–145)
Total Bilirubin: 1.1 mg/dL (ref 0.2–1.2)
Total Protein: 7.2 g/dL (ref 6.0–8.3)

## 2023-10-20 LAB — CBC WITH DIFFERENTIAL/PLATELET
Basophils Absolute: 0 10*3/uL (ref 0.0–0.1)
Basophils Relative: 0.4 % (ref 0.0–3.0)
Eosinophils Absolute: 0.1 10*3/uL (ref 0.0–0.7)
Eosinophils Relative: 1.7 % (ref 0.0–5.0)
HCT: 42.1 % (ref 39.0–52.0)
Hemoglobin: 14.2 g/dL (ref 13.0–17.0)
Lymphocytes Relative: 25.2 % (ref 12.0–46.0)
Lymphs Abs: 1.2 10*3/uL (ref 0.7–4.0)
MCHC: 33.8 g/dL (ref 30.0–36.0)
MCV: 97 fL (ref 78.0–100.0)
Monocytes Absolute: 0.6 10*3/uL (ref 0.1–1.0)
Monocytes Relative: 12.6 % — ABNORMAL HIGH (ref 3.0–12.0)
Neutro Abs: 2.9 10*3/uL (ref 1.4–7.7)
Neutrophils Relative %: 60.1 % (ref 43.0–77.0)
Platelets: 240 10*3/uL (ref 150.0–400.0)
RBC: 4.34 Mil/uL (ref 4.22–5.81)
RDW: 13.3 % (ref 11.5–15.5)
WBC: 4.8 10*3/uL (ref 4.0–10.5)

## 2023-10-20 LAB — VITAMIN D 25 HYDROXY (VIT D DEFICIENCY, FRACTURES): VITD: 27.59 ng/mL — ABNORMAL LOW (ref 30.00–100.00)

## 2023-10-20 LAB — LIPID PANEL
Cholesterol: 186 mg/dL (ref 0–200)
HDL: 60.3 mg/dL (ref >39.00)
LDL Cholesterol: 111 mg/dL — ABNORMAL HIGH (ref 0–99)
NonHDL: 125.7
Total CHOL/HDL Ratio: 3
Triglycerides: 73 mg/dL (ref 0.0–149.0)
VLDL: 14.6 mg/dL (ref 0.0–40.0)

## 2023-10-20 LAB — TSH: TSH: 0.57 u[IU]/mL (ref 0.35–5.50)

## 2023-10-20 LAB — VITAMIN B12: Vitamin B-12: 526 pg/mL (ref 211–911)

## 2023-10-20 LAB — T4, FREE: Free T4: 0.92 ng/dL (ref 0.60–1.60)

## 2023-10-20 LAB — PSA: PSA: 0.76 ng/mL (ref 0.10–4.00)

## 2023-10-20 MED ORDER — COVID-19 MRNA VAC-TRIS(PFIZER) 30 MCG/0.3ML IM SUSY
0.3000 mL | PREFILLED_SYRINGE | Freq: Once | INTRAMUSCULAR | 0 refills | Status: AC
  Filled 2023-10-20: qty 0.3, 1d supply, fill #0

## 2023-10-20 NOTE — Progress Notes (Signed)
[id number]  Subjective:    Patient ID: Walter Harvey, male    DOB: 28-Nov-1981, 42 y.o.   MRN: [id number]  HPI  Pt has cpe today.    Pt works at [employer], married has 3 children. Pt states exercising a lot due to travel for work. Plans to restart.. Married and has 3 children.  Pt got flu vaccine yesterday at work. Did not get covid vaccine this year. Discussed benefit of covid vaccine. He states will get.   Hx of low vit D 2 years ago.  Pt does report some fatigue. Hx of low vit D and low b12 per pt. Low when checked in [place] past year. Pt denies snoring. When traveling and in different hotels does not sleep well. A lot of stress traveling.     Review of Systems  Constitutional:  Positive for fatigue. Negative for chills and fever.  HENT:  Negative for congestion.   Respiratory:  Negative for cough, chest tightness and wheezing.   Cardiovascular:  Negative for chest pain and palpitations.  Gastrointestinal:  Negative for abdominal pain, diarrhea and nausea.  Genitourinary:  Negative for dysuria, flank pain, frequency, hematuria and urgency.  Musculoskeletal:  Negative for back pain and myalgias.  Skin:  Negative for rash.  Neurological:  Negative for dizziness, speech difficulty, weakness, numbness and headaches.  Hematological:  Negative for adenopathy. Does not bruise/bleed easily.  Psychiatric/Behavioral:  Negative for behavioral problems, decreased concentration and sleep disturbance. The patient is not nervous/anxious.     Past Medical History:  Diagnosis Date   Anxiety 04/05/2018   SVT (supraventricular tachycardia) (HCC)      Social History   Socioeconomic History   Marital status: Married    Spouse name: Not on file   Number of children: Not on file   Years of education: Not on file   Highest education level: Not on file  Occupational History   Not on file  Tobacco Use   Smoking status: Never   Smokeless tobacco: Never  Vaping Use   Vaping status: Never Used   Substance and Sexual Activity   Alcohol use: Yes   Drug use: No   Sexual activity: Not on file  Other Topics Concern   Not on file  Social History Narrative   Not on file   Social Determinants of Health   Financial Resource Strain: Not on file  Food Insecurity: Not on file  Transportation Needs: Not on file  Physical Activity: Not on file  Stress: Not on file  Social Connections: Not on file  Intimate Partner Violence: Not on file    No past surgical history on file.  No family history on file.  No Known Allergies  Current Outpatient Medications on File Prior to Visit  Medication Sig Dispense Refill   Vitamin D, Ergocalciferol, (DRISDOL) 1.25 MG (50000 UNIT) CAPS capsule Take 5,000 Units by mouth daily.     No current facility-administered medications on file prior to visit.    BP 108/62   Pulse 76   Temp 98 F (36.7 C)   Resp 18   Ht 5\' 9"  (1.753 m)   Wt 156 lb (70.8 kg)   SpO2 100%   BMI 23.04 kg/m        Objective:   Physical Exam  General Mental Status- Alert. General Appearance- Not in acute distress.   Skin General: Color- Normal Color. Moisture- Normal Moisture.  Neck Carotid Arteries- Normal color. Moisture- Normal Moisture. No carotid bruits. No JVD.  Chest and  Lung Exam Auscultation: Breath Sounds:-Normal.  Cardiovascular Auscultation:Rythm- Regular. Murmurs & Other Heart Sounds:Auscultation of the heart reveals- No Murmurs.  Abdomen Inspection:-Inspeection Normal. Palpation/Percussion:Note:No mass. Palpation and Percussion of the abdomen reveal- Non Tender, Non Distended + BS, no rebound or guarding.  Neurologic Cranial Nerve exam:- CN III-XII intact(No nystagmus), symmetric smile. Strength:- 5/5 equal and symmetric strength both upper and lower extremities.   Rectal- pt declined prostate exam    Assessment & Plan:   Patient Instructions  For you wellness exam today I have ordered cbc, cmp and lipid.  Fatigue- tsh, t4, b12,  b1 and vit d level.  Low vit d- level check today  Screening psa lab.  Vaccine/flu up to date. Will get covid at pharmacy.  Recommend exercise and healthy diet.  We will let you know lab results as they come in.  Follow up date appointment will be determined after lab review.      Esperanza Richters, [place]   [id number] charge as did discuss fatigue and associated work up.

## 2023-10-20 NOTE — Patient Instructions (Addendum)
For you wellness exam today I have ordered cbc, cmp and lipid.  Fatigue- tsh, t4, b12, b1 and vit d level.  Low vit d- level check today  Screening psa lab.  Vaccine/flu up to date. Will get covid at pharmacy.  Recommend exercise and healthy diet.  We will let you know lab results as they come in.  Follow up date appointment will be determined after lab review.    Preventive Care 44-44 Years Old, Male Preventive care refers to lifestyle choices and visits with your health care provider that can promote health and wellness. Preventive care visits are also called wellness exams. What can I expect for my preventive care visit? Counseling During your preventive care visit, your health care provider may ask about your: Medical history, including: Past medical problems. Family medical history. Current health, including: Emotional well-being. Home life and relationship well-being. Sexual activity. Lifestyle, including: Alcohol, nicotine or tobacco, and drug use. Access to firearms. Diet, exercise, and sleep habits. Safety issues such as seatbelt and bike helmet use. Sunscreen use. Work and work Astronomer. Physical exam Your health care provider will check your: Height and weight. These may be used to calculate your BMI (body mass index). BMI is a measurement that tells if you are at a healthy weight. Waist circumference. This measures the distance around your waistline. This measurement also tells if you are at a healthy weight and may help predict your risk of certain diseases, such as type 2 diabetes and high blood pressure. Heart rate and blood pressure. Body temperature. Skin for abnormal spots. What immunizations do I need?  Vaccines are usually given at various ages, according to a schedule. Your health care provider will recommend vaccines for you based on your age, medical history, and lifestyle or other factors, such as travel or where you work. What tests do I  need? Screening Your health care provider may recommend screening tests for certain conditions. This may include: Lipid and cholesterol levels. Diabetes screening. This is done by checking your blood sugar (glucose) after you have not eaten for a while (fasting). Hepatitis B test. Hepatitis C test. HIV (human immunodeficiency virus) test. STI (sexually transmitted infection) testing, if you are at risk. Lung cancer screening. Prostate cancer screening. Colorectal cancer screening. Talk with your health care provider about your test results, treatment options, and if necessary, the need for more tests. Follow these instructions at home: Eating and drinking  Eat a diet that includes fresh fruits and vegetables, whole grains, lean protein, and low-fat dairy products. Take vitamin and mineral supplements as recommended by your health care provider. Do not drink alcohol if your health care provider tells you not to drink. If you drink alcohol: Limit how much you have to 0-2 drinks a day. Know how much alcohol is in your drink. In the [place] one drink equals one 12 oz bottle of beer (355 mL), one 5 oz glass of wine (148 mL), or one 1 oz glass of hard liquor (44 mL). Lifestyle Brush your teeth every morning and night with fluoride toothpaste. Floss one time each day. Exercise for at least 30 minutes 5 or more days each week. Do not use any products that contain nicotine or tobacco. These products include cigarettes, chewing tobacco, and vaping devices, such as e-cigarettes. If you need help quitting, ask your health care provider. Do not use drugs. If you are sexually active, practice safe sex. Use a condom or other form of protection to prevent STIs. Take aspirin only  as told by your health care provider. Make sure that you understand how much to take and what form to take. Work with your health care provider to find out whether it is safe and beneficial for you to take aspirin daily. Find  healthy ways to manage stress, such as: Meditation, yoga, or listening to music. Journaling. Talking to a trusted person. Spending time with friends and family. Minimize exposure to UV radiation to reduce your risk of skin cancer. Safety Always wear your seat belt while driving or riding in a vehicle. Do not drive: If you have been drinking alcohol. Do not ride with someone who has been drinking. When you are tired or distracted. While texting. If you have been using any mind-altering substances or drugs. Wear a helmet and other protective equipment during sports activities. If you have firearms in your house, make sure you follow all gun safety procedures. What's next? Go to your health care provider once a year for an annual wellness visit. Ask your health care provider how often you should have your eyes and teeth checked. Stay up to date on all vaccines. This information is not intended to replace advice given to you by your health care provider. Make sure you discuss any questions you have with your health care provider. Document Revised: 06/02/2021 Document Reviewed: 06/02/2021 Elsevier Patient Education  2024 [employer].

## 2023-10-21 MED ORDER — VITAMIN D (ERGOCALCIFEROL) 1.25 MG (50000 UNIT) PO CAPS: 50000.0000 [IU] | ORAL_CAPSULE | ORAL | 0 refills | Status: AC

## 2023-10-21 NOTE — Addendum Note (Signed)
Addended by: Gwenevere Abbot on: 10/21/2023 05:08 AM   Modules accepted: Orders

## 2023-10-25 LAB — VITAMIN B1: Vitamin B1 (Thiamine): 14 nmol/L (ref 8–30)

## 2024-10-15 ENCOUNTER — Other Ambulatory Visit: Payer: Self-pay

## 2024-10-15 ENCOUNTER — Other Ambulatory Visit: Payer: Self-pay | Admitting: Urology

## 2024-10-15 ENCOUNTER — Emergency Department (HOSPITAL_COMMUNITY)

## 2024-10-15 ENCOUNTER — Emergency Department (HOSPITAL_COMMUNITY)
Admission: EM | Admit: 2024-10-15 | Discharge: 2024-10-15 | Disposition: A | Discharge status: Home / Self Care | Attending: Emergency Medicine | Admitting: Emergency Medicine

## 2024-10-15 ENCOUNTER — Encounter (HOSPITAL_COMMUNITY): Payer: Self-pay | Admitting: Emergency Medicine

## 2024-10-15 DIAGNOSIS — N433 Hydrocele, unspecified: Secondary | ICD-10-CM | POA: Diagnosis not present

## 2024-10-15 DIAGNOSIS — N200 Calculus of kidney: Secondary | ICD-10-CM | POA: Diagnosis not present

## 2024-10-15 DIAGNOSIS — N201 Calculus of ureter: Secondary | ICD-10-CM | POA: Diagnosis not present

## 2024-10-15 DIAGNOSIS — N132 Hydronephrosis with renal and ureteral calculous obstruction: Secondary | ICD-10-CM | POA: Insufficient documentation

## 2024-10-15 DIAGNOSIS — N5082 Scrotal pain: Secondary | ICD-10-CM | POA: Diagnosis not present

## 2024-10-15 DIAGNOSIS — Z79899 Other long term (current) drug therapy: Secondary | ICD-10-CM | POA: Insufficient documentation

## 2024-10-15 DIAGNOSIS — N50819 Testicular pain, unspecified: Secondary | ICD-10-CM | POA: Diagnosis not present

## 2024-10-15 DIAGNOSIS — N23 Unspecified renal colic: Secondary | ICD-10-CM | POA: Diagnosis not present

## 2024-10-15 DIAGNOSIS — R1031 Right lower quadrant pain: Secondary | ICD-10-CM | POA: Diagnosis not present

## 2024-10-15 LAB — CBC WITH DIFFERENTIAL/PLATELET
Abs Immature Granulocytes: 0.03 K/uL (ref 0.00–0.07)
Basophils Absolute: 0 K/uL (ref 0.0–0.1)
Basophils Relative: 0 %
Eosinophils Absolute: 0.1 K/uL (ref 0.0–0.5)
Eosinophils Relative: 1 %
HCT: 32.8 % — ABNORMAL LOW (ref 39.0–52.0)
Hemoglobin: 11.8 g/dL — ABNORMAL LOW (ref 13.0–17.0)
Immature Granulocytes: 0 %
Lymphocytes Relative: 12 %
Lymphs Abs: 0.9 K/uL (ref 0.7–4.0)
MCH: 32.8 pg (ref 26.0–34.0)
MCHC: 36 g/dL (ref 30.0–36.0)
MCV: 91.1 fL (ref 80.0–100.0)
Monocytes Absolute: 0.6 K/uL (ref 0.1–1.0)
Monocytes Relative: 9 %
Neutro Abs: 5.6 K/uL (ref 1.7–7.7)
Neutrophils Relative %: 78 %
Platelets: 232 K/uL (ref 150–400)
RBC: 3.6 MIL/uL — ABNORMAL LOW (ref 4.22–5.81)
RDW: 12.3 % (ref 11.5–15.5)
WBC: 7.2 K/uL (ref 4.0–10.5)
nRBC: 0 % (ref 0.0–0.2)

## 2024-10-15 LAB — BASIC METABOLIC PANEL WITH GFR
Anion gap: 8 (ref 5–15)
BUN: 11 mg/dL (ref 6–20)
CO2: 18 mmol/L — ABNORMAL LOW (ref 22–32)
Calcium: 7.5 mg/dL — ABNORMAL LOW (ref 8.9–10.3)
Chloride: 114 mmol/L — ABNORMAL HIGH (ref 98–111)
Creatinine, Ser: 0.88 mg/dL (ref 0.61–1.24)
GFR, Estimated: >60 mL/min (ref >60)
Glucose, Bld: 118 mg/dL — ABNORMAL HIGH (ref 70–99)
Potassium: 3.1 mmol/L — ABNORMAL LOW (ref 3.5–5.1)
Sodium: 140 mmol/L (ref 135–145)

## 2024-10-15 LAB — URINALYSIS, ROUTINE W REFLEX MICROSCOPIC
Bacteria, UA: NONE SEEN
Bilirubin Urine: NEGATIVE
Glucose, UA: NEGATIVE mg/dL
Ketones, ur: 5 mg/dL — AB
Leukocytes,Ua: NEGATIVE
Nitrite: NEGATIVE
Protein, ur: NEGATIVE mg/dL
Specific Gravity, Urine: 1.011 (ref 1.005–1.030)
pH: 8 (ref 5.0–8.0)

## 2024-10-15 MED ORDER — TAMSULOSIN HCL 0.4 MG PO CAPS: 0.4000 mg | ORAL_CAPSULE | Freq: Every day | ORAL | 0 refills | Status: AC

## 2024-10-15 MED ORDER — TAMSULOSIN HCL 0.4 MG PO CAPS: 0.4000 mg | ORAL_CAPSULE | Freq: Every day | ORAL | 0 refills | Status: DC

## 2024-10-15 MED ORDER — KETOROLAC TROMETHAMINE 30 MG/ML IJ SOLN
30.0000 mg | Freq: Once | INTRAMUSCULAR | Status: AC
  Administered 2024-10-15: 30 mg via INTRAVENOUS
  Filled 2024-10-15: qty 1

## 2024-10-15 MED ORDER — SODIUM CHLORIDE 0.9 % IV BOLUS
1000.0000 mL | Freq: Once | INTRAVENOUS | Status: AC
  Administered 2024-10-15: 1000 mL via INTRAVENOUS

## 2024-10-15 MED ORDER — ONDANSETRON HCL 4 MG/2ML IJ SOLN
4.0000 mg | Freq: Once | INTRAMUSCULAR | Status: AC
  Administered 2024-10-15: 4 mg via INTRAVENOUS
  Filled 2024-10-15: qty 2

## 2024-10-15 MED ORDER — MORPHINE SULFATE (PF) 4 MG/ML IV SOLN
4.0000 mg | Freq: Once | INTRAVENOUS | Status: AC
  Administered 2024-10-15: 4 mg via INTRAVENOUS
  Filled 2024-10-15: qty 1

## 2024-10-15 MED ORDER — OXYCODONE-ACETAMINOPHEN 5-325 MG PO TABS: 1.0000 | ORAL_TABLET | Freq: Four times a day (QID) | ORAL | 0 refills | Status: DC | PRN

## 2024-10-15 MED ORDER — ONDANSETRON 4 MG PO TBDP: 4.0000 mg | ORAL_TABLET | Freq: Three times a day (TID) | ORAL | 0 refills | Status: AC | PRN

## 2024-10-15 MED ORDER — OXYCODONE-ACETAMINOPHEN 5-325 MG PO TABS: 1.0000 | ORAL_TABLET | Freq: Four times a day (QID) | ORAL | 0 refills | Status: AC | PRN

## 2024-10-15 MED ORDER — ONDANSETRON 4 MG PO TBDP: 4.0000 mg | ORAL_TABLET | Freq: Three times a day (TID) | ORAL | 0 refills | Status: DC | PRN

## 2024-10-15 MED ORDER — HYDROMORPHONE HCL 1 MG/ML IJ SOLN
1.0000 mg | Freq: Once | INTRAMUSCULAR | Status: AC
  Administered 2024-10-15: 1 mg via INTRAVENOUS
  Filled 2024-10-15: qty 1

## 2024-10-15 NOTE — ED Provider Notes (Signed)
 Urinalysis negative.  Patient will be discharged   Dasie Faden, MD 10/15/24 [phone]

## 2024-10-15 NOTE — Discharge Instructions (Signed)
 You were seen today for testicle and right lower quadrant pain.  You have evidence of a kidney stone.  Make sure that you are staying hydrated.  Take pain and nausea medication as needed.  Follow-up with urology if symptoms not improving.  If you develop fevers, any new or worsening symptoms, you should be reevaluated.

## 2024-10-15 NOTE — ED Triage Notes (Signed)
 Patient endorses right testicle pain that started 1 hour ago.  Patient reports it woke him up from sleep.

## 2024-10-15 NOTE — ED Provider Notes (Signed)
 [place] EMERGENCY DEPARTMENT AT [facility] Provider Note   CSN: [id number] Arrival date & time: 10/15/24  9661     Patient presents with: Testicle Pain   Walter Harvey is a 43 y.o. male.   HPI     This is a 43 year old male who presents with acute onset right lower quadrant and right testicle pain.  Patient states he has had associated nausea and vomiting.  He states that he went to bed fine.  This woke him up from his sleep.  Had some back pain but no urinary symptoms or dysuria.  No known history of kidney stones.  Has not felt a mass in his scrotum.  Low suspicion for STDs.  Prior to Admission medications   Medication Sig Start Date End Date Taking? Authorizing Provider  ondansetron (ZOFRAN-ODT) 4 MG disintegrating tablet Take 1 tablet (4 mg total) by mouth every 8 (eight) hours as needed. 10/15/24  Yes Lamondre Wesche, Charmaine FALCON, MD  oxyCODONE-acetaminophen (PERCOCET/ROXICET) 5-325 MG tablet Take 1 tablet by mouth every 6 (six) hours as needed for severe pain (pain score 7-10). 10/15/24  Yes Johnpaul Gillentine, Charmaine FALCON, MD  tamsulosin (FLOMAX) 0.4 MG CAPS capsule Take 1 capsule (0.4 mg total) by mouth daily. 10/15/24  Yes Kayd Launer, Charmaine FALCON, MD  Vitamin D , Ergocalciferol , (DRISDOL ) 1.25 MG (50000 UNIT) CAPS capsule Take 1 capsule (50,000 Units total) by mouth every 7 (seven) days. 10/21/23   Saguier, [place], PA-C    Allergies: Patient has no known allergies.    Review of Systems  Constitutional:  Negative for fever.  Respiratory:  Negative for shortness of breath.   Cardiovascular:  Negative for chest pain.  Gastrointestinal:  Positive for vomiting.  Genitourinary:  Positive for flank pain and testicular pain.  All other systems reviewed and are negative.   Updated Vital Signs BP 118/76   Pulse 79   Temp (!) 97.4 F (36.3 C) (Oral)   Resp 18   Wt 71 kg   SpO2 98%   BMI 23.11 kg/m   Physical Exam Vitals and nursing note reviewed.  Constitutional:      Appearance:  He is well-developed.     Comments: Uncomfortable appearing but nontoxic  HENT:     Head: Normocephalic and atraumatic.  Eyes:     Pupils: Pupils are equal, round, and reactive to light.  Cardiovascular:     Rate and Rhythm: Normal rate and regular rhythm.     Heart sounds: Normal heart sounds. No murmur heard. Pulmonary:     Effort: Pulmonary effort is normal. No respiratory distress.     Breath sounds: Normal breath sounds. No wheezing.  Abdominal:     General: Bowel sounds are normal.     Palpations: Abdomen is soft.     Tenderness: There is no abdominal tenderness. There is no rebound.  Genitourinary:    Comments: Normal testicular lie, no significant tenderness to palpation of the right testicle, it does not appear high riding or hard, there is no obvious mass or hernia Musculoskeletal:     Cervical back: Neck supple.  Lymphadenopathy:     Cervical: No cervical adenopathy.  Skin:    General: Skin is warm and dry.  Neurological:     Mental Status: He is alert and oriented to person, place, and time.  Psychiatric:        Mood and Affect: Mood normal.     (all labs ordered are listed, but only abnormal results are displayed) Labs Reviewed  CBC WITH  DIFFERENTIAL/PLATELET - Abnormal; Notable for the following components:      Result Value   RBC 3.60 (*)    Hemoglobin 11.8 (*)    HCT 32.8 (*)    All other components within normal limits  BASIC METABOLIC PANEL WITH GFR - Abnormal; Notable for the following components:   Potassium 3.1 (*)    Chloride 114 (*)    CO2 18 (*)    Glucose, Bld 118 (*)    Calcium 7.5 (*)    All other components within normal limits  URINALYSIS, ROUTINE W REFLEX MICROSCOPIC    EKG: None  Radiology: US  SCROTUM W/DOPPLER Result Date: 10/15/2024 EXAM: ULTRASOUND SCROTUM/TESTICLES WITH DOPPLER FLOW EVALUATION 10/15/2024 05:14:43 AM TECHNIQUE: Duplex ultrasound using B-mode/gray scaled imaging, Doppler spectral analysis and color flow Doppler  was obtained of the testicles. COMPARISON: No similar prior study. CT abdomen and pelvis without contrast was performed earlier today. . . CLINICAL HISTORY: Testicle pain. Right scrotal pain with CT today demonstrating a 5 mm proximal right ureteral stone. FINDINGS: RIGHT: MEASUREMENTS: The right testicle measures 4.2 x 2.6 x 3.3 cm. GREY SCALE: The right testicle demonstrates normal homogeneous echotexture without focal lesion. No testicular microlithiasis. DOPPLER EVALUATION: There is normal arterial and venous Doppler flow within the testicle. VARICOCELE: No scrotal varicocele. SCROTAL SAC: Small size right scrotal hydrocele, which appears uncomplicated. EPIDIDYMIS: No acute abnormality. LEFT: MEASUREMENTS: The left testicle measures 4.4 x 3.0 x 2.7 cm. GREY SCALE: The left testicle demonstrates normal homogeneous echotexture without focal lesion. No testicular microlithiasis. DOPPLER EVALUATION: There is normal arterial and venous Doppler flow within the testicle. VARICOCELE: No scrotal varicocele. SCROTAL SAC: Moderate size left scrotal hydrocele um , which appears uncomplicated. EPIDIDYMIS: No acute abnormality. IMPRESSION: 1. No evidence of testicular mass, torsion or other acute abnormality. 2. Small right and moderate left hydroceles, both uncomplicated. Electronically signed by: Francis Quam MD 10/15/2024 05:27 AM EDT RP Workstation: [id number]   CT Renal Stone Study Result Date: 10/15/2024 EXAM: CT UROGRAM 10/15/2024 04:25:27 AM TECHNIQUE: CT of the abdomen and pelvis was performed before and after the administration of intravenous contrast as per CT urogram protocol. Multiplanar reformatted images as well as MIP urogram images are provided for review. Automated exposure control, iterative reconstruction, and/or weight based adjustment of the mA/kV was utilized to reduce the radiation dose to as low as reasonably achievable. COMPARISON: None available. CLINICAL HISTORY: Abdominal/flank pain, stone  suspected. Patient endorses right testicle pain that started 1 hour ago. Patient reports it woke him up from sleep. FINDINGS: LOWER CHEST: No acute abnormality. LIVER: The liver is mildly enlarged, measuring 21 cm in length. No focal abnormality is seen without contrast. GALLBLADDER AND BILE DUCTS: Gallbladder is unremarkable. No biliary ductal dilatation. SPLEEN: No acute abnormality. PANCREAS: No acute abnormality. ADRENAL GLANDS: No acute abnormality. KIDNEYS, URETERS AND BLADDER: In the left kidney, there are occasional punctate nonobstructive calyceal stones. In the right kidney, there is a greater number of punctate up to 2 to 3 mm calyceal stones, and mild hydronephrosis above a 5 x 4 x 3 mm proximal ureteral stone at the level of L3-L4. Both ureters are otherwise clear. No perinephric or periureteral stranding. The bladder is unremarkable. Multiple pelvic phleboliths. GI AND BOWEL: There is a tiny hiatal hernia. Unremarkable stomach and unopacified small bowel. Normal appendix. Moderate retained stool in the ascending and transverse colon. Otherwise unremarkable colon. PERITONEUM AND RETROPERITONEUM: No ascites. No free air. VASCULATURE: Aorta is normal in caliber. There are calcific plaques in both  femoral arteries, greater than expected for a 43 year old. Clinical correlation for underlying etiology and risk factor modification recommended. LYMPH NODES: No lymphadenopathy. REPRODUCTIVE ORGANS: No acute abnormality. Both testicles are in the scrotal sac. BONES AND SOFT TISSUES: There are a few bone islands in the pelvis and proximal femurs. No acute or significant osseous findings or suspicious lesions. No focal soft tissue abnormality. IMPRESSION: 1. Right proximal ureteral calculus measuring 5 x 4 x 3 mm at the L3L4 level with mild upstream hydronephrosis. Correlate clinically for infectious complication. 2. Bilateral nonobstructive nephrolithiasis with punctate calyceal stones, more numerous on the  right where they measure up to 23 mm. 3. Mildly prominent liver length. 4. Constipation. 5. Bilateral femoral artery calcifications, greater than expected in a 43 year old. Clinical correlation recommended for underlying etiology and for risk factor modification. Electronically signed by: Francis Quam MD 10/15/2024 04:52 AM EDT RP Workstation: [id number]     Procedures   Medications Ordered in the ED  sodium chloride 0.9 % bolus 1,000 mL (0 mLs Intravenous Stopped 10/15/24 0638)  ondansetron (ZOFRAN) injection 4 mg (4 mg Intravenous Given 10/15/24 0452)  morphine (PF) 4 MG/ML injection 4 mg (4 mg Intravenous Given 10/15/24 0453)  HYDROmorphone (DILAUDID) injection 1 mg (1 mg Intravenous Given 10/15/24 0537)  ketorolac (TORADOL) 30 MG/ML injection 30 mg (30 mg Intravenous Given 10/15/24 9364)                                    Medical Decision Making Amount and/or Complexity of Data Reviewed Labs: ordered. Radiology: ordered.  Risk Prescription drug management.   This patient presents to the ED for concern of right lower quadrant and testicle pain, this involves an extensive number of treatment options, and is a complaint that carries with it a high risk of complications and morbidity.  I considered the following differential and admission for this acute, potentially life threatening condition.  The differential diagnosis includes torsion, kidney stone, hernia, epididymitis, UTI  MDM:    This a 43 year old male who presents with acute onset right testicle pain.  He is uncomfortable appearing but nontoxic.  Testicle exam is largely reassuring.  It is not high riding or hard.  I think this makes torsion less likely although it is still in the differential.  Megan with this could be a kidney stone.  He has had some nausea and vomiting.  Patient was given fluids, pain, nausea medication.  Labs obtained.  Preserved creatinine.  No significant leukocytosis.  Has not been able to provide  urine yet.  CT imaging does show a 5 x 4 x 3 mm stone on the right.  Ultrasound negative for torsion.  Discussed with the patient and his wife.  Will need follow-up with urology.  Patient signed out pending pain control and urinalysis.  (Labs, imaging, consults)  Labs: I Ordered, and personally interpreted labs.  The pertinent results include: CBC, BMP  Imaging Studies ordered: I ordered imaging studies including CT stone study, ultrasound testicle I independently visualized and interpreted imaging. I agree with the radiologist interpretation  Additional history obtained from chart review.  External records from outside source obtained and reviewed including prior evaluations  Cardiac Monitoring: The patient was maintained on a cardiac monitor.  If on the cardiac monitor, I personally viewed and interpreted the cardiac monitored which showed an underlying rhythm of: Sinus  Reevaluation: After the interventions noted above, I reevaluated the patient and  found that they have :stayed the same  Social Determinants of Health:  lives independently  Disposition: Pending, signed out to oncoming provider  Co morbidities that complicate the patient evaluation  Past Medical History:  Diagnosis Date   Anxiety 04/05/2018   SVT (supraventricular tachycardia)      Medicines Meds ordered this encounter  Medications   sodium chloride 0.9 % bolus 1,000 mL   ondansetron (ZOFRAN) injection 4 mg   morphine (PF) 4 MG/ML injection 4 mg   HYDROmorphone (DILAUDID) injection 1 mg   ketorolac (TORADOL) 30 MG/ML injection 30 mg   oxyCODONE-acetaminophen (PERCOCET/ROXICET) 5-325 MG tablet    Sig: Take 1 tablet by mouth every 6 (six) hours as needed for severe pain (pain score 7-10).    Dispense:  10 tablet    Refill:  0   tamsulosin (FLOMAX) 0.4 MG CAPS capsule    Sig: Take 1 capsule (0.4 mg total) by mouth daily.    Dispense:  30 capsule    Refill:  0   ondansetron (ZOFRAN-ODT) 4 MG  disintegrating tablet    Sig: Take 1 tablet (4 mg total) by mouth every 8 (eight) hours as needed.    Dispense:  20 tablet    Refill:  0    I have reviewed the patients home medicines and have made adjustments as needed  Problem List / ED Course: Problem List Items Addressed This Visit   None Visit Diagnoses       Kidney stone    -  Primary   Relevant Medications   morphine (PF) 4 MG/ML injection 4 mg (Completed)   HYDROmorphone (DILAUDID) injection 1 mg (Completed)   oxyCODONE-acetaminophen (PERCOCET/ROXICET) 5-325 MG tablet                Final diagnoses:  Kidney stone    ED Discharge Orders          Ordered    oxyCODONE-acetaminophen (PERCOCET/ROXICET) 5-325 MG tablet  Every 6 hours PRN        10/15/24 0653    tamsulosin (FLOMAX) 0.4 MG CAPS capsule  Daily        10/15/24 0653    ondansetron (ZOFRAN-ODT) 4 MG disintegrating tablet  Every 8 hours PRN        10/15/24 0653               Bari Charmaine FALCON, MD 10/15/24 [phone]

## 2024-10-16 DIAGNOSIS — N23 Unspecified renal colic: Secondary | ICD-10-CM | POA: Diagnosis not present

## 2024-10-16 DIAGNOSIS — N201 Calculus of ureter: Secondary | ICD-10-CM | POA: Diagnosis not present

## 2024-10-18 ENCOUNTER — Encounter (HOSPITAL_COMMUNITY): Admission: RE | Payer: Self-pay | Source: Home / Self Care

## 2024-10-18 ENCOUNTER — Ambulatory Visit (HOSPITAL_COMMUNITY): Admission: RE | Admit: 2024-10-18 | Admitting: Urology

## 2024-10-18 SURGERY — LITHOTRIPSY, ESWL
Anesthesia: LOCAL | Laterality: Right

## 2024-10-25 ENCOUNTER — Ambulatory Visit: Admitting: Medical

## 2024-10-25 ENCOUNTER — Encounter: Payer: Self-pay | Admitting: Medical

## 2024-10-25 VITALS — BP 98/64 | HR 71 | Ht 69.0 in | Wt 154.8 lb

## 2024-10-25 DIAGNOSIS — R739 Hyperglycemia, unspecified: Secondary | ICD-10-CM | POA: Diagnosis not present

## 2024-10-25 DIAGNOSIS — N200 Calculus of kidney: Secondary | ICD-10-CM | POA: Diagnosis not present

## 2024-10-25 DIAGNOSIS — R8271 Bacteriuria: Secondary | ICD-10-CM | POA: Diagnosis not present

## 2024-10-25 DIAGNOSIS — D649 Anemia, unspecified: Secondary | ICD-10-CM

## 2024-10-25 DIAGNOSIS — E785 Hyperlipidemia, unspecified: Secondary | ICD-10-CM

## 2024-10-25 DIAGNOSIS — R7989 Other specified abnormal findings of blood chemistry: Secondary | ICD-10-CM

## 2024-10-25 DIAGNOSIS — F439 Reaction to severe stress, unspecified: Secondary | ICD-10-CM | POA: Diagnosis not present

## 2024-10-25 LAB — CBC WITH DIFFERENTIAL/PLATELET
Basophils Absolute: 0 K/uL (ref 0.0–0.1)
Basophils Relative: 0.2 % (ref 0.0–3.0)
Eosinophils Absolute: 0.1 K/uL (ref 0.0–0.7)
Eosinophils Relative: 1.3 % (ref 0.0–5.0)
HCT: 42.7 % (ref 39.0–52.0)
Hemoglobin: 14.5 g/dL (ref 13.0–17.0)
Lymphocytes Relative: 34.5 % (ref 12.0–46.0)
Lymphs Abs: 1.5 K/uL (ref 0.7–4.0)
MCHC: 34 g/dL (ref 30.0–36.0)
MCV: 95.7 fl (ref 78.0–100.0)
Monocytes Absolute: 0.5 K/uL (ref 0.1–1.0)
Monocytes Relative: 11.7 % (ref 3.0–12.0)
Neutro Abs: 2.4 K/uL (ref 1.4–7.7)
Neutrophils Relative %: 52.3 % (ref 43.0–77.0)
Platelets: 310 K/uL (ref 150.0–400.0)
RBC: 4.46 Mil/uL (ref 4.22–5.81)
RDW: 12.9 % (ref 11.5–15.5)
WBC: 4.5 K/uL (ref 4.0–10.5)

## 2024-10-25 LAB — POC URINALSYSI DIPSTICK (AUTOMATED)
Bilirubin, UA: NEGATIVE
Blood, UA: NEGATIVE
Glucose, UA: NEGATIVE
Ketones, UA: NEGATIVE
Leukocytes, UA: NEGATIVE
Nitrite, UA: NEGATIVE
Protein, UA: NEGATIVE
Spec Grav, UA: <=1.005 — AB (ref 1.010–1.025)
Urobilinogen, UA: 0.2 U/dL
pH, UA: 6 (ref 5.0–8.0)

## 2024-10-25 LAB — LIPID PANEL
Cholesterol: 175 mg/dL (ref 0–200)
HDL: 60.8 mg/dL (ref >39.00)
LDL Cholesterol: 100 mg/dL — ABNORMAL HIGH (ref 0–99)
NonHDL: 114.3
Total CHOL/HDL Ratio: 3
Triglycerides: 74 mg/dL (ref 0.0–149.0)
VLDL: 14.8 mg/dL (ref 0.0–40.0)

## 2024-10-25 LAB — HEMOGLOBIN A1C: Hgb A1c MFr Bld: 5.1 % (ref 4.6–6.5)

## 2024-10-25 LAB — VITAMIN D 25 HYDROXY (VIT D DEFICIENCY, FRACTURES): VITD: 17.71 ng/mL — ABNORMAL LOW (ref 30.00–100.00)

## 2024-10-25 NOTE — Patient Instructions (Signed)
 Nephrolithiasis (Kidney Stones) Stone passed. No current pain or urinary symptoms. - Continue increased fluid intake to prevent future stones.  Bacteriuria, under evaluation Bacteria detected in urine sample. Urine sample collected today. - Performed urinalysis and urine culture to recheck as told grew out bacteria by urologist but no antibiotic given. Note and culture not visible. - Will consider antibiotics based on culture results.  Anemia, under evaluation Previous blood exam indicated anemia. No symptoms of black stools or significant fatigue. - Ordered CBC and iron panel to evaluate anemia.  Hyperglycemia, under evaluation Recent blood glucose levels elevated. Family history of diabetes. Stress and lifestyle changes may contribute. - Ordered HbA1c to assess average blood glucose levels over the past three months.  Hyperlipidemia Previous cholesterol levels elevated. Lipid panel result from one year ago available. - Ordered lipid panel to assess current cholesterol levels.  Vitamin D  deficiency, under evaluation Previous vitamin D  levels low. Not taking supplements. - Ordered vitamin D  level to assess current status.  Fatigue, under evaluation Reports of fatigue, possibly related to stress and health issues. Previous vitamin D  deficiency noted. - Ordered vitamin D  level to assess contribution to fatigue.  Reaction to severe stress (adjustment reaction) Significant stress due to immigration issues. Symptoms include fatigue and sleep disturbances. No medication needed as stress is situational. - Continue to monitor stress levels and symptoms. - Will consider follow-up in 3-4 weeks to reassess stress and fatigue.

## 2024-10-25 NOTE — Progress Notes (Signed)
 Subjective:    Patient ID: Walter Harvey, male    DOB: Jan 27, 1981, 43 y.o.   MRN: [id number]  HPI  Pt was seen in ED just recently.   Walter Harvey is a 43 year old male with a history of kidney stones who presents with recent urinary symptoms.  He experienced severe pain due to a kidney stone starting on October 15, 2024, which led to an emergency room visit where he received morphine for pain relief. He was diagnosed with a kidney stone and was prescribed medication to aid in passing the stone, which occurred on October 16, 2024. The patient reports that the urologist verbally told him there were three stones, with one larger stone causing significant pain, although the radiology report only described one stone. Surgery was initially scheduled for October 18, 2024, but was canceled after the stone passed naturally. No current urinary symptoms or kidney stone pain.  During the emergency room visit, he was informed of bacteria in his urine. He questions the accuracy of this result due to the influence of morphine and other factors at the time of testing. He has not been prescribed antibiotics for this finding.  He has a history of supraventricular tachycardia for which he underwent heart surgery in the past. Recent lab results indicated low vitamin D  levels, elevated glucose, and low potassium. He is experiencing significant stress due to an impending move back to [place], which is affecting his sleep and overall health. He feels tired, wakes up early, and has difficulty maintaining sleep due to stress, averaging about seven hours of sleep per night.     Review of Systems  Constitutional:  Negative for chills, fatigue and fever.  Respiratory:  Negative for cough and chest tightness.   Cardiovascular:  Negative for palpitations.  Gastrointestinal:  Negative for abdominal pain, blood in stool and nausea.  Genitourinary:  Negative for dysuria, hematuria and testicular pain.   Musculoskeletal:  Negative for back pain and neck pain.  Skin:  Negative for rash and wound.  Neurological:  Negative for dizziness and numbness.  Hematological:  Negative for adenopathy.  Psychiatric/Behavioral:  Negative for behavioral problems and suicidal ideas. The patient is nervous/anxious.     Past Medical History:  Diagnosis Date   Anxiety 04/05/2018   SVT (supraventricular tachycardia)      Social History   Socioeconomic History   Marital status: Married    Spouse name: Not on file   Number of children: Not on file   Years of education: Not on file   Highest education level: Bachelor's degree (e.g., BA, AB, BS)  Occupational History   Not on file  Tobacco Use   Smoking status: Never   Smokeless tobacco: Never  Vaping Use   Vaping status: Never Used  Substance and Sexual Activity   Alcohol use: Yes   Drug use: No   Sexual activity: Not on file  Other Topics Concern   Not on file  Social History Narrative   Not on file   Social Drivers of Health   Financial Resource Strain: Low Risk  (10/24/2024)   Overall Financial Resource Strain (CARDIA)    Difficulty of Paying Living Expenses: Not very hard  Food Insecurity: No Food Insecurity (10/24/2024)   Hunger Vital Sign    Worried About Running Out of Food in the Last Year: Never true    Ran Out of Food in the Last Year: Never true  Transportation Needs: No Transportation Needs (10/24/2024)   PRAPARE -  Administrator, Civil Service (Medical): No    Lack of Transportation (Non-Medical): No  Physical Activity: Insufficiently Active (10/24/2024)   Exercise Vital Sign    Days of Exercise per Week: 2 days    Minutes of Exercise per Session: 30 min  Stress: Stress Concern Present (10/24/2024)   [employer] of Occupational Health - Occupational Stress Questionnaire    Feeling of Stress: Very much  Social Connections: Moderately Isolated (10/24/2024)   Social Connection and Isolation Panel    Frequency  of Communication with Friends and Family: More than three times a week    Frequency of Social Gatherings with Friends and Family: Once a week    Attends Religious Services: Never    Database Administrator or Organizations: No    Attends Engineer, Structural: Not on file    Marital Status: Married  Catering Manager Violence: Not on file    No past surgical history on file.  No family history on file.  No Known Allergies  Current Outpatient Medications on File Prior to Visit  Medication Sig Dispense Refill   tamsulosin (FLOMAX) 0.4 MG CAPS capsule Take 1 capsule (0.4 mg total) by mouth daily. 30 capsule 0   ondansetron (ZOFRAN-ODT) 4 MG disintegrating tablet Take 1 tablet (4 mg total) by mouth every 8 (eight) hours as needed. 20 tablet 0   oxyCODONE-acetaminophen (PERCOCET/ROXICET) 5-325 MG tablet Take 1 tablet by mouth every 6 (six) hours as needed for severe pain (pain score 7-10). 10 tablet 0   Vitamin D , Ergocalciferol , (DRISDOL ) 1.25 MG (50000 UNIT) CAPS capsule Take 1 capsule (50,000 Units total) by mouth every 7 (seven) days. 8 capsule 0   No current facility-administered medications on file prior to visit.    BP 98/64 (BP Location: Left Arm, Patient Position: Sitting, Cuff Size: Normal)   Pulse 71   Ht 5' 9 (1.753 m)   Wt 154 lb 12.8 oz (70.2 kg)   SpO2 98%   BMI 22.86 kg/m        Objective:   Physical Exam  General Mental Status- Alert. General Appearance- Not in acute distress.   Skin General: Color- Normal Color. Moisture- Normal Moisture.  Neck Carotid Arteries- Normal color. Moisture- Normal Moisture. No carotid bruits. No JVD.  Chest and Lung Exam Auscultation: Breath Sounds:-Normal.  Cardiovascular Auscultation:Rythm- Regular. Murmurs & Other Heart Sounds:Auscultation of the heart reveals- No Murmurs.  Abdomen Inspection:-Inspeection Normal. Palpation/Percussion:Note:No mass. Palpation and Percussion of the abdomen reveal- Non Tender,  Non Distended + BS, no rebound or guarding.   Neurologic Cranial Nerve exam:- CN III-XII intact(No nystagmus), symmetric smile. Strength:- 5/5 equal and symmetric strength both upper and lower extremities.       Assessment & Plan:   Nephrolithiasis (Kidney Stones) Stone passed. No current pain or urinary symptoms. - Continue increased fluid intake to prevent future stones.  Bacteriuria, under evaluation Bacteria detected in urine sample. Urine sample collected today. - Performed urinalysis and urine culture to recheck as told grew out bacteria by urologist but no antibiotic given. Note and culture not visible. - Will consider antibiotics based on culture results.  Anemia, under evaluation Previous blood exam indicated anemia. No symptoms of black stools or significant fatigue. - Ordered CBC and iron panel to evaluate anemia.  Hyperglycemia, under evaluation Recent blood glucose levels elevated. Family history of diabetes. Stress and lifestyle changes may contribute. - Ordered HbA1c to assess average blood glucose levels over the past three months.  Hyperlipidemia Previous  cholesterol levels elevated. Lipid panel result from one year ago available. - Ordered lipid panel to assess current cholesterol levels.  Vitamin D  deficiency, under evaluation Previous vitamin D  levels low. Not taking supplements. - Ordered vitamin D  level to assess current status.  Fatigue, under evaluation Reports of fatigue, possibly related to stress and health issues. Previous vitamin D  deficiency noted. - Ordered vitamin D  level to assess contribution to fatigue.  Reaction to severe stress (adjustment reaction) Significant stress due to immigration issues. Symptoms include fatigue and sleep disturbances. No medication needed as stress is situational. - Continue to monitor stress levels and symptoms. - Will consider follow-up in 3-4 weeks to reassess stress and fatigue.  Haliyah Fryman, PA-C

## 2024-10-26 ENCOUNTER — Ambulatory Visit: Payer: Self-pay | Admitting: Medical

## 2024-10-26 LAB — COMPLETE METABOLIC PANEL WITHOUT GFR
AG Ratio: 1.8 (calc) (ref 1.0–2.5)
ALT: 19 U/L (ref 9–46)
AST: 14 U/L (ref 10–40)
Albumin: 4.9 g/dL (ref 3.6–5.1)
Alkaline phosphatase (APISO): 49 U/L (ref 36–130)
BUN: 13 mg/dL (ref 7–25)
CO2: 29 mmol/L (ref 20–32)
Calcium: 9.9 mg/dL (ref 8.6–10.3)
Chloride: 102 mmol/L (ref 98–110)
Creat: 0.82 mg/dL (ref 0.60–1.29)
Globulin: 2.8 g/dL (ref 1.9–3.7)
Glucose, Bld: 86 mg/dL (ref 65–99)
Potassium: 4.8 mmol/L (ref 3.5–5.3)
Sodium: 138 mmol/L (ref 135–146)
Total Bilirubin: 1.1 mg/dL (ref 0.2–1.2)
Total Protein: 7.7 g/dL (ref 6.1–8.1)

## 2024-10-26 LAB — IRON,TIBC AND FERRITIN PANEL
%SAT: 46 % (ref 20–48)
Ferritin: 220 ng/mL (ref 38–380)
Iron: 156 ug/dL (ref 50–180)
TIBC: 340 ug/dL (ref 250–425)

## 2024-10-26 LAB — URINE CULTURE
MICRO NUMBER:: 17206511
Result:: NO GROWTH
SPECIMEN QUALITY:: ADEQUATE

## 2024-10-26 MED ORDER — VITAMIN D (ERGOCALCIFEROL) 1.25 MG (50000 UNIT) PO CAPS: 50000.0000 [IU] | ORAL_CAPSULE | ORAL | 0 refills | Status: AC

## 2024-10-26 NOTE — Addendum Note (Signed)
 Addended by: DORINA DALLAS HERO on: 10/26/2024 08:59 AM   Modules accepted: Orders
